# Patient Record
Sex: Male | Born: 1975 | Race: White | Hispanic: No | Marital: Single | Smoking: Current every day smoker
Health system: Southern US, Community
[De-identification: ages and names within clinical notes are randomized; demographics above are authoritative.]

## PROBLEM LIST (undated history)

## (undated) DIAGNOSIS — I469 Cardiac arrest, cause unspecified: Secondary | ICD-10-CM

## (undated) DIAGNOSIS — I5020 Unspecified systolic (congestive) heart failure: Secondary | ICD-10-CM

## (undated) DIAGNOSIS — E785 Hyperlipidemia, unspecified: Secondary | ICD-10-CM

## (undated) DIAGNOSIS — Z720 Tobacco use: Secondary | ICD-10-CM

## (undated) DIAGNOSIS — I2510 Atherosclerotic heart disease of native coronary artery without angina pectoris: Secondary | ICD-10-CM

## (undated) DIAGNOSIS — I213 ST elevation (STEMI) myocardial infarction of unspecified site: Secondary | ICD-10-CM

## (undated) DIAGNOSIS — I10 Essential (primary) hypertension: Secondary | ICD-10-CM

## (undated) DIAGNOSIS — F1290 Cannabis use, unspecified, uncomplicated: Secondary | ICD-10-CM

## (undated) DIAGNOSIS — I255 Ischemic cardiomyopathy: Secondary | ICD-10-CM

## (undated) DIAGNOSIS — F1490 Cocaine use, unspecified, uncomplicated: Secondary | ICD-10-CM

## (undated) HISTORY — DX: Cocaine use, unspecified, uncomplicated: F14.90

## (undated) HISTORY — DX: Essential (primary) hypertension: I10

## (undated) HISTORY — DX: Atherosclerotic heart disease of native coronary artery without angina pectoris: I25.10

## (undated) HISTORY — DX: Cannabis use, unspecified, uncomplicated: F12.90

## (undated) HISTORY — DX: Unspecified systolic (congestive) heart failure: I50.20

## (undated) HISTORY — DX: Tobacco use: Z72.0

## (undated) HISTORY — DX: Ischemic cardiomyopathy: I25.5

## (undated) HISTORY — DX: Hyperlipidemia, unspecified: E78.5

## (undated) HISTORY — DX: ST elevation (STEMI) myocardial infarction of unspecified site: I21.3

## (undated) HISTORY — DX: Cardiac arrest, cause unspecified: I46.9

---

## 2006-05-23 ENCOUNTER — Emergency Department: Payer: Self-pay | Admitting: Emergency Medicine

## 2009-06-29 ENCOUNTER — Emergency Department: Payer: Self-pay | Admitting: Emergency Medicine

## 2009-07-22 ENCOUNTER — Emergency Department: Payer: Self-pay | Admitting: Unknown Physician Specialty

## 2009-08-16 ENCOUNTER — Emergency Department: Payer: Self-pay | Admitting: Emergency Medicine

## 2010-08-29 ENCOUNTER — Emergency Department: Payer: Self-pay | Admitting: Emergency Medicine

## 2012-09-11 ENCOUNTER — Emergency Department: Payer: Self-pay | Admitting: Emergency Medicine

## 2012-09-11 LAB — URINALYSIS, COMPLETE
Blood: NEGATIVE
Glucose,UR: NEGATIVE mg/dL (ref 0–75)
Ketone: NEGATIVE
Ph: 5 (ref 4.5–8.0)
RBC,UR: 1 /HPF (ref 0–5)
Specific Gravity: 1.031 (ref 1.003–1.030)
Squamous Epithelial: NONE SEEN

## 2012-09-11 LAB — CBC
HGB: 16.2 g/dL (ref 13.0–18.0)
MCH: 30.7 pg (ref 26.0–34.0)
MCHC: 34.6 g/dL (ref 32.0–36.0)
MCV: 89 fL (ref 80–100)
WBC: 8 10*3/uL (ref 3.8–10.6)

## 2012-09-11 LAB — COMPREHENSIVE METABOLIC PANEL
Alkaline Phosphatase: 93 U/L (ref 50–136)
Anion Gap: 6 — ABNORMAL LOW (ref 7–16)
BUN: 9 mg/dL (ref 7–18)
Co2: 23 mmol/L (ref 21–32)
Creatinine: 1.21 mg/dL (ref 0.60–1.30)
EGFR (African American): >60
Glucose: 94 mg/dL (ref 65–99)
Osmolality: 272 (ref 275–301)
Potassium: 3.8 mmol/L (ref 3.5–5.1)
SGOT(AST): 21 U/L (ref 15–37)
Sodium: 137 mmol/L (ref 136–145)

## 2012-09-11 LAB — CSF CELL COUNT WITH DIFFERENTIAL
CSF Tube #: 3
CSF Tube #: 1
Eosinophil: 0 %
Eosinophil: 1 %
Lymphocytes: 63 %
Monocytes/Macrophages: 0 %
Monocytes/Macrophages: 12 %
Neutrophils: 0 %
Neutrophils: 24 %
Other Cells: 0 %
Other Cells: 0 %
RBC (CSF): 0 /mm3
RBC (CSF): 125 /mm3
WBC (CSF): 0 /mm3
WBC (CSF): 2 /mm3

## 2012-09-11 LAB — PROTEIN, CSF: Protein, CSF: 89 mg/dL — ABNORMAL HIGH (ref 15–45)

## 2012-09-11 LAB — APTT: Activated PTT: 36.7 secs — ABNORMAL HIGH (ref 23.6–35.9)

## 2012-09-11 LAB — PROTIME-INR
INR: 0.9
Prothrombin Time: 12.8 secs (ref 11.5–14.7)

## 2012-09-11 LAB — GLUCOSE, CSF: Glucose, CSF: 64 mg/dL (ref 40–75)

## 2012-09-15 LAB — CSF CULTURE W GRAM STAIN

## 2013-02-20 ENCOUNTER — Emergency Department: Payer: Self-pay | Admitting: Emergency Medicine

## 2013-08-28 ENCOUNTER — Emergency Department: Payer: Self-pay | Admitting: Emergency Medicine

## 2013-08-28 LAB — URINALYSIS, COMPLETE
BACTERIA: NONE SEEN
BILIRUBIN, UR: NEGATIVE
Blood: NEGATIVE
Glucose,UR: NEGATIVE mg/dL (ref 0–75)
Leukocyte Esterase: NEGATIVE
NITRITE: NEGATIVE
PROTEIN: NEGATIVE
Ph: 5 (ref 4.5–8.0)
Specific Gravity: 1.033 (ref 1.003–1.030)
Squamous Epithelial: <1
WBC UR: 1 /HPF (ref 0–5)

## 2013-08-28 LAB — CBC
HCT: 42.6 % (ref 40.0–52.0)
HGB: 14.2 g/dL (ref 13.0–18.0)
MCH: 30.2 pg (ref 26.0–34.0)
MCHC: 33.4 g/dL (ref 32.0–36.0)
MCV: 91 fL (ref 80–100)
Platelet: 261 10*3/uL (ref 150–440)
RBC: 4.71 10*6/uL (ref 4.40–5.90)
RDW: 13.8 % (ref 11.5–14.5)
WBC: 10.2 10*3/uL (ref 3.8–10.6)

## 2013-08-28 LAB — COMPREHENSIVE METABOLIC PANEL
ALBUMIN: 3.9 g/dL (ref 3.4–5.0)
ALK PHOS: 79 U/L
Anion Gap: 7 (ref 7–16)
BILIRUBIN TOTAL: 0.5 mg/dL (ref 0.2–1.0)
BUN: 9 mg/dL (ref 7–18)
Calcium, Total: 8.9 mg/dL (ref 8.5–10.1)
Chloride: 104 mmol/L (ref 98–107)
Co2: 26 mmol/L (ref 21–32)
Creatinine: 1.11 mg/dL (ref 0.60–1.30)
EGFR (African American): >60
EGFR (Non-African Amer.): >60
Glucose: 141 mg/dL — ABNORMAL HIGH (ref 65–99)
Osmolality: 275 (ref 275–301)
Potassium: 3.7 mmol/L (ref 3.5–5.1)
SGOT(AST): 30 U/L (ref 15–37)
SGPT (ALT): 38 U/L (ref 12–78)
Sodium: 137 mmol/L (ref 136–145)
TOTAL PROTEIN: 7.8 g/dL (ref 6.4–8.2)

## 2015-07-28 IMAGING — CT CT STONE STUDY
2 of 4 series · 17 of 46 positions shown, 19 images · non-contrast
Comparison: 06/29/2009.

CLINICAL DATA: Low back pain radiating to the left flank.

EXAM:
CT ABDOMEN AND PELVIS WITHOUT CONTRAST
TECHNIQUE: Multidetector CT imaging of the abdomen and pelvis was performed
following the standard protocol without IV contrast.

[Series 2: stone standard full · axial · 0.78mm/px · z∈[+129,+589]mm · 14 of 102 slices shown, 16 images]
[im 5/102  soft-tissue]
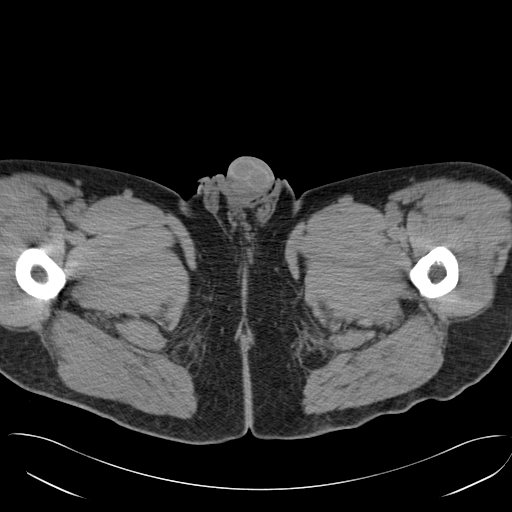
[im 5/102  bone]
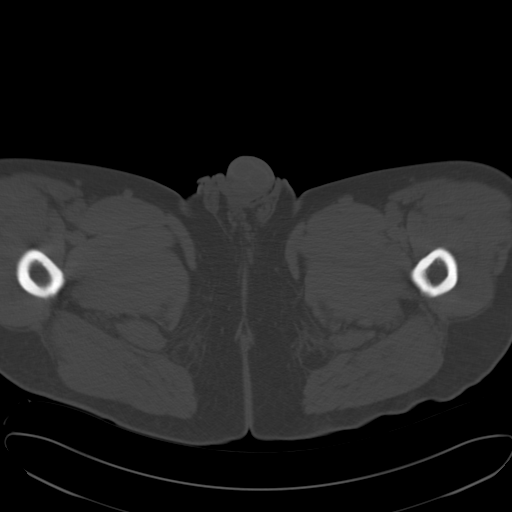
[im 13/102  soft-tissue]
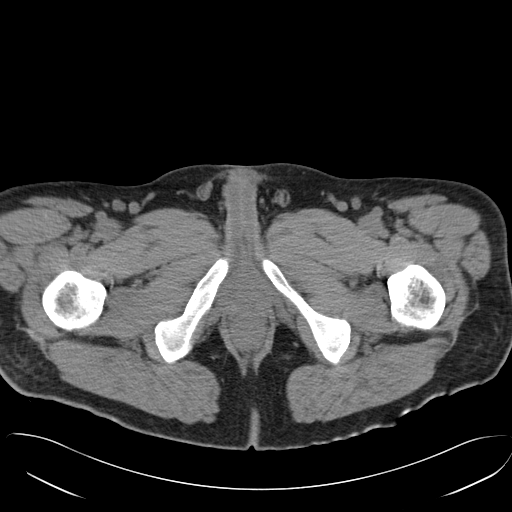
[im 21/102  soft-tissue]
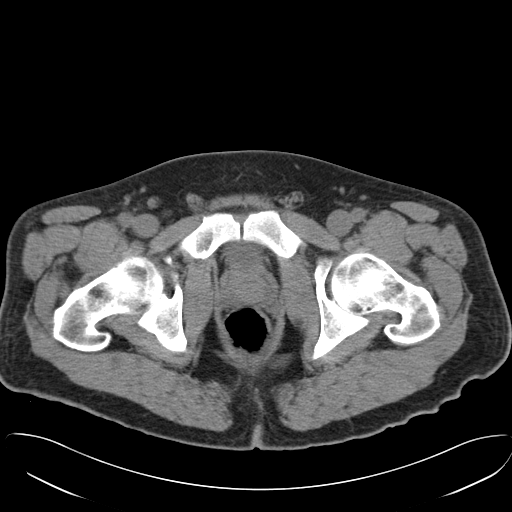
[im 29/102  soft-tissue]
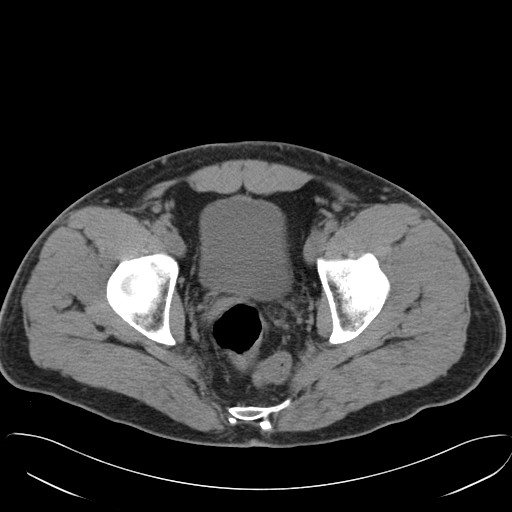
[im 33/102  soft-tissue]
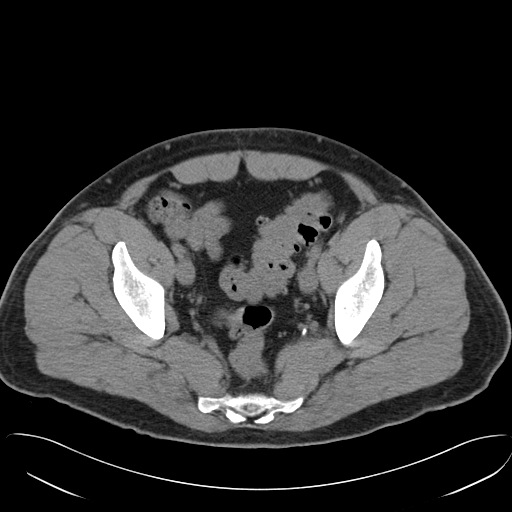
[im 41/102  soft-tissue]
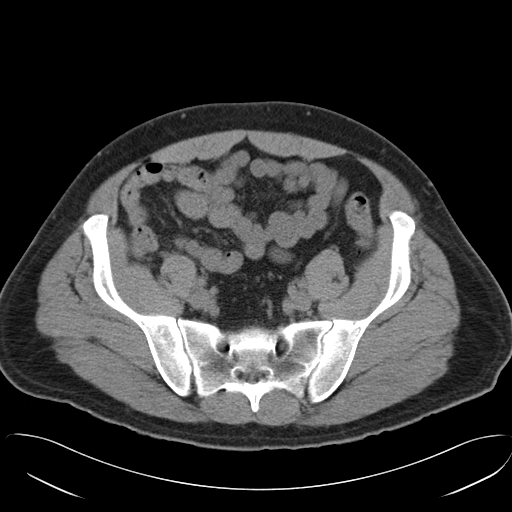
[im 49/102  soft-tissue]
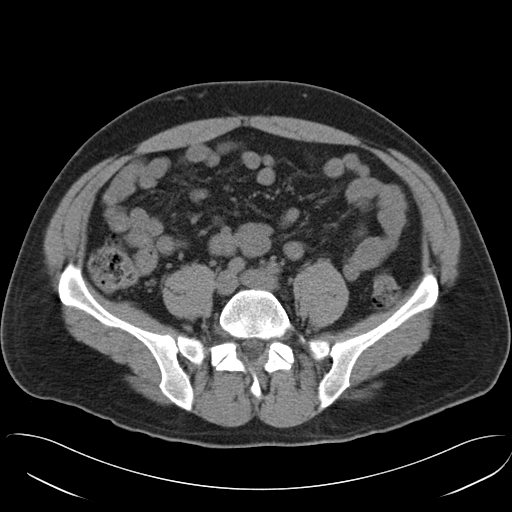
[im 53/102  soft-tissue]
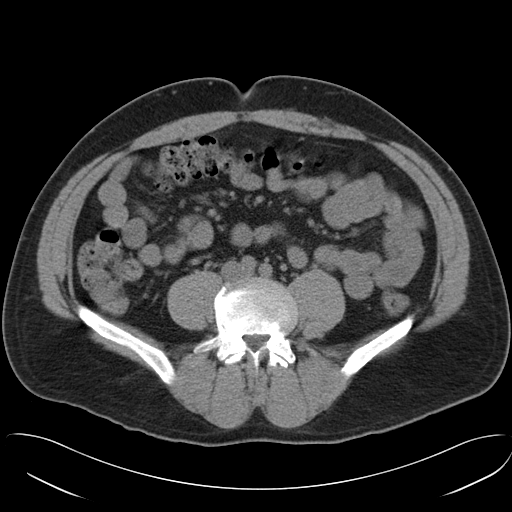
[im 61/102  soft-tissue]
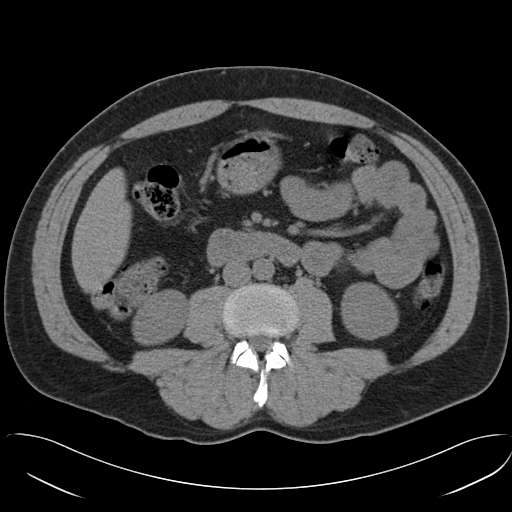
[im 61/102  bone]
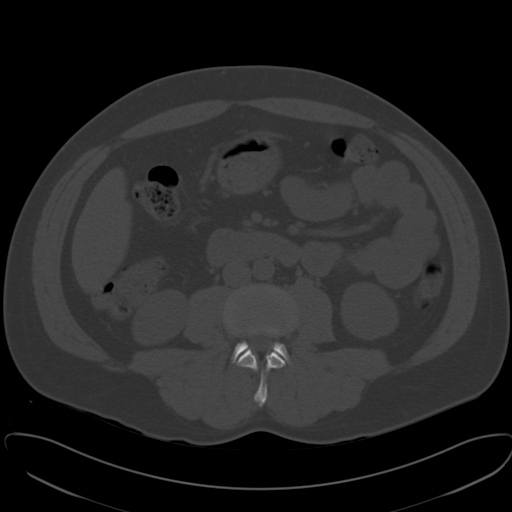
[im 69/102  soft-tissue]
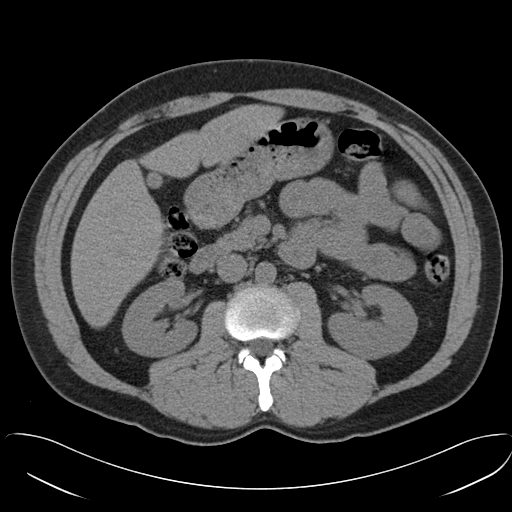
[im 77/102  soft-tissue]
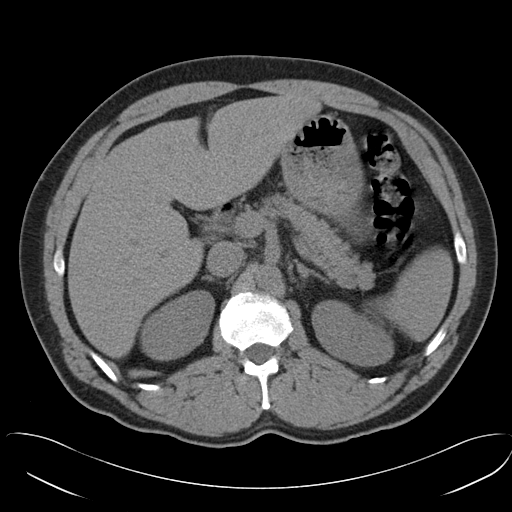
[im 81/102  soft-tissue]
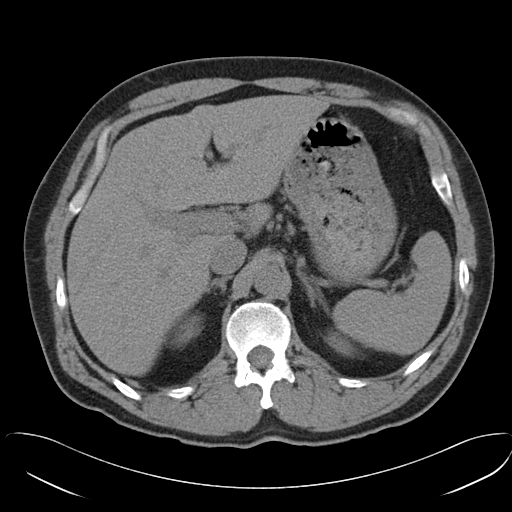
[im 89/102  soft-tissue]
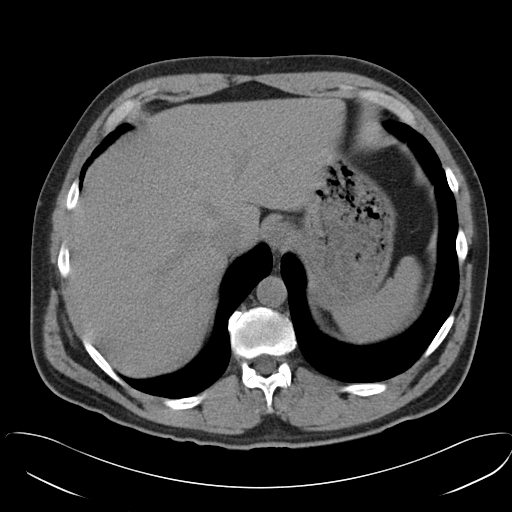
[im 97/102  soft-tissue]
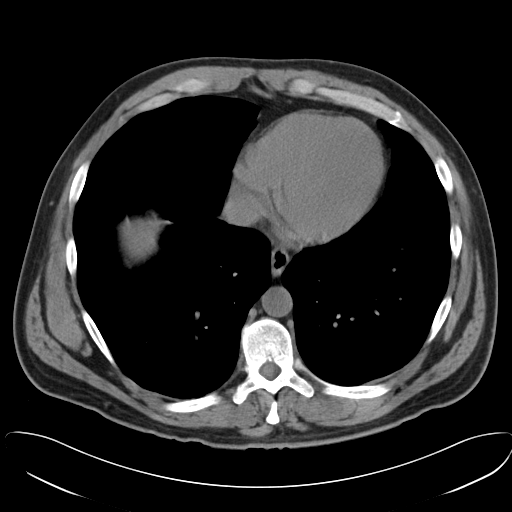

[Series 5: cor stone standard full · coronal · 0.98mm/px · 3 of 147 slices shown]
[im 49/147  soft-tissue]
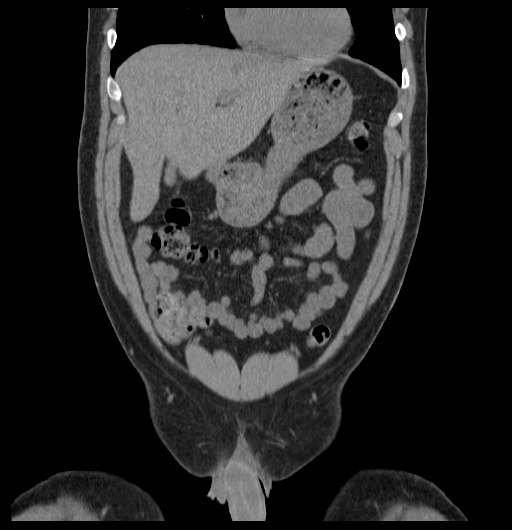
[im 65/147  soft-tissue]
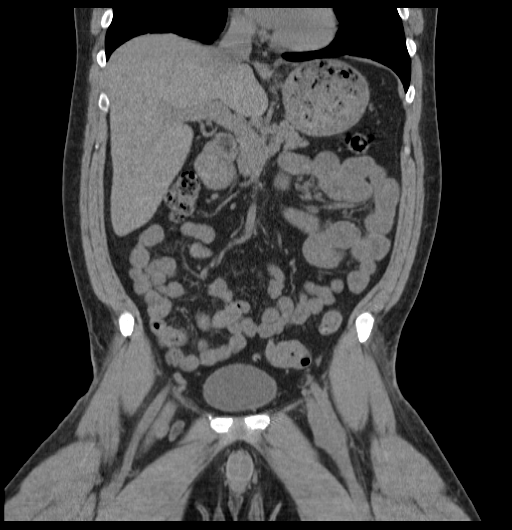
[im 82/147  soft-tissue]
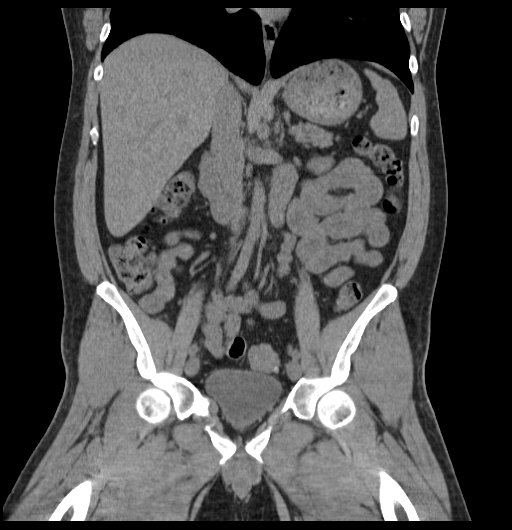

[17 of 46 positions shown; findings below may reference images not displayed]

FINDINGS: Lung Bases: Patchy areas of ground-glass attenuation are seen in
both lower lungs, as before and likely represent compressive
atelectasis.

Liver: Enlarged at 21 cm in craniocaudal length. Otherwise normal
uninfused appearance.

Spleen: Normal uninfused imaging features.

Stomach: Normal.

Pancreas: No focal mass lesion.  No dilatation of the main duct.

Gallbladder/Biliary: Gallbladder is decompressed. There is no intra
or extrahepatic biliary duct dilatation.

Kidneys/Adrenals: No adrenal nodule or mass. No evidence for stones
in either kidney. No ureteral or bladder stones. No secondary
changes in either kidney or ureter.

Bowel Loops: Duodenum is in normal position. No small bowel
obstruction. No evidence for small bowel wall thickening. Terminal
ileum is normal. The appendix is normal. No gross colonic mass.
There is some diverticular changes in the sigmoid colon without
diverticulitis.

Nodes: No pelvic sidewall lymphadenopathy. There is no
retroperitoneal or intraperitoneal lymphadenopathy in the abdomen.

Vasculature: No abdominal aortic aneurysm.

Pelvic Genitourinary: Bladder is normal for the degree of
distention. Prostate gland is normal.

Bones/Musculoskeletal: Bone windows reveal no worrisome lytic or
sclerotic osseous lesions.

Body Wall: No evidence for abdominal wall hernia.

Other: No intraperitoneal free fluid.
IMPRESSION: 1. No acute findings in the abdomen or pelvis. Specifically, no
findings to explain the patient's history of low-back pain and left
flank pain.
2. Hepatomegaly.

## 2018-02-17 ENCOUNTER — Encounter: Payer: Self-pay | Admitting: Emergency Medicine

## 2018-02-17 ENCOUNTER — Emergency Department
Admission: EM | Admit: 2018-02-17 | Discharge: 2018-02-17 | Disposition: A | Discharge status: Home / Self Care | Payer: Self-pay | Attending: Emergency Medicine | Admitting: Emergency Medicine

## 2018-02-17 ENCOUNTER — Other Ambulatory Visit: Payer: Self-pay

## 2018-02-17 DIAGNOSIS — Z87891 Personal history of nicotine dependence: Secondary | ICD-10-CM | POA: Insufficient documentation

## 2018-02-17 DIAGNOSIS — J101 Influenza due to other identified influenza virus with other respiratory manifestations: Secondary | ICD-10-CM | POA: Insufficient documentation

## 2018-02-17 LAB — INFLUENZA PANEL BY PCR (TYPE A & B)
INFLAPCR: POSITIVE — AB
INFLBPCR: NEGATIVE

## 2018-02-17 MED ORDER — IBUPROFEN 800 MG PO TABS
800.0000 mg | ORAL_TABLET | Freq: Once | ORAL | Status: AC
  Administered 2018-02-17: 800 mg via ORAL
  Filled 2018-02-17: qty 1

## 2018-02-17 MED ORDER — HYDROCODONE-HOMATROPINE 5-1.5 MG/5ML PO SYRP: 5.0000 mL | ORAL_SOLUTION | Freq: Four times a day (QID) | ORAL | 0 refills | Status: DC | PRN

## 2018-02-17 NOTE — ED Triage Notes (Signed)
Fever/chills/cough x1 day

## 2018-02-17 NOTE — ED Provider Notes (Signed)
[facility] Emergency Department Provider Note  ____________________________________________   First MD Initiated Contact with Patient 02/17/18 1000     (approximate)  I have reviewed the triage vital signs and the nursing notes.   HISTORY  Chief Complaint Fever    HPI Terrance Thompson is a 42 y.o. male with flulike symptoms, patient is complaining of fever, chills, body aches.  Positive cough, positive sore throat, denies vomiting, denies diarrhea; denies chest pain or sob.  Sx for 1 days   History reviewed. No pertinent past medical history.  There are no active problems to display for this patient.   History reviewed. No pertinent surgical history.  Prior to Admission medications   Medication Sig Start Date End Date Taking? Authorizing Provider  HYDROcodone-homatropine (HYCODAN) 5-1.5 MG/5ML syrup Take 5 mLs by mouth every 6 (six) hours as needed. 02/17/18   Faythe GheeFisher,  W, PA-C    Allergies Patient has no known allergies.  No family history on file.  Social History Social History   Tobacco Use  . Smoking status: Former Games developermoker  . Smokeless tobacco: Former Engineer, waterUser  Substance Use Topics  . Alcohol use: Not on file  . Drug use: Not on file    Review of Systems  Constitutional: Positive fever/chills, body aches Eyes: No visual changes. ENT: No sore throat. Respiratory: Positive cough Genitourinary: Negative for dysuria. Musculoskeletal: Negative for back pain. Skin: Negative for rash.    ____________________________________________   PHYSICAL EXAM:  VITAL SIGNS: ED Triage Vitals  Enc Vitals Group     BP 02/17/18 0922 (!) 146/92     Pulse Rate 02/17/18 0922 (!) 115     Resp 02/17/18 0922 20     Temp 02/17/18 0922 (!) 102.5 F (39.2 C)     Temp Source 02/17/18 0922 Oral     SpO2 02/17/18 0922 95 %     Weight 02/17/18 0923 228 lb (103.4 kg)     Height 02/17/18 0923 6' (1.829 m)     Head Circumference --      Peak Flow --        Pain Score 02/17/18 0922 10     Pain Loc --      Pain Edu? --      Excl. in GC? --     Constitutional: Alert and oriented. Well appearing and in no acute distress. Eyes: Conjunctivae are normal.  Head: Atraumatic. Nose: No congestion/rhinnorhea. Mouth/Throat: Mucous membranes are moist.  Throat appears normal Neck:  supple no lymphadenopathy noted Cardiovascular: Normal rate, regular rhythm. Heart sounds are normal Respiratory: Normal respiratory effort.  No retractions, lungs c t a, cough is very dry and hacking Abd: soft nontender bs normal all 4 quad GU: deferred Musculoskeletal: FROM all extremities, warm and well perfused Neurologic:  Normal speech and language.  Skin:  Skin is warm, dry and intact. No rash noted. Psychiatric: Mood and affect are normal. Speech and behavior are normal.  ____________________________________________   LABS (all labs ordered are listed, but only abnormal results are displayed)  Labs Reviewed  INFLUENZA PANEL BY PCR (TYPE A & B) - Abnormal; Notable for the following components:      Result Value   Influenza A By PCR POSITIVE (*)    All other components within normal limits   ____________________________________________   ____________________________________________  RADIOLOGY    ____________________________________________   PROCEDURES  Procedure(s) performed: No  Procedures    ____________________________________________   INITIAL IMPRESSION / ASSESSMENT AND PLAN / ED  COURSE  Pertinent labs & imaging results that were available during my care of the patient were reviewed by me and considered in my medical decision making (see chart for details).   Patient is 42 year old male presents emergency department flulike symptoms.  Physical exam shows a febrile adult with elevated heart rate.  Cough is very dry and hacking.  Remainder of the exam is unremarkable  Flu swab is positive for influenza A  Patient had been  given ibuprofen 800 mg p.o. while here in the ED for fever.  He was encouraged to start drinking fluids.  He was able to tolerate water while in the ED.  He was given a prescription for Hycodan cough syrup.  We discussed Tamiflu and he states due to the price he does not want to buy this medication.  He was given a work note stating that he is contagious until he is not had a fever for 24 hours.  He states he understands will comply with all instructions.  Was discharged in stable condition.     As part of my medical decision making, I reviewed the following data within the electronic MEDICAL RECORD NUMBER History obtained from family, Nursing notes reviewed and incorporated, Labs reviewed flu test is positive for influenza A, Notes from prior ED visits and [place] Controlled Substance Database  ____________________________________________   FINAL CLINICAL IMPRESSION(S) / ED DIAGNOSES  Final diagnoses:  Influenza A      NEW MEDICATIONS STARTED DURING THIS VISIT:  New Prescriptions   HYDROCODONE-HOMATROPINE (HYCODAN) 5-1.5 MG/5ML SYRUP    Take 5 mLs by mouth every 6 (six) hours as needed.     Note:  This document was prepared using Dragon voice recognition software and may include unintentional dictation errors.    Faythe GheeFisher,  W, PA-C 02/17/18 1101    Sharman CheekStafford, Phillip, MD 02/25/18 [phone]

## 2018-02-17 NOTE — ED Notes (Signed)
Pt c/o fever with bodyaches and cough congestion that started last night. Last taken any medication was last night,.

## 2018-02-17 NOTE — Discharge Instructions (Addendum)
Follow-up with your regular doctor the acute care if not better in 3 to 5 days.  Return to the emergency department if you are worsening.    Return to the emergency department if you feel you are getting a secondary infection such as pneumonia.  The symptoms would include increasing cough, shortness of breath, or chest pain..  Take the cough medication as prescribed.  Tylenol and ibuprofen for fever.  Drink plenty of fluids.

## 2022-07-22 ENCOUNTER — Emergency Department: Payer: Self-pay

## 2022-07-22 ENCOUNTER — Other Ambulatory Visit: Payer: Self-pay

## 2022-07-22 ENCOUNTER — Inpatient Hospital Stay
Admission: EM | Admit: 2022-07-22 | Discharge: 2022-07-25 | DRG: 321 | Disposition: A | Discharge status: Home / Self Care | Payer: Self-pay | Attending: Internal Medicine | Admitting: Internal Medicine

## 2022-07-22 ENCOUNTER — Inpatient Hospital Stay (HOSPITAL_COMMUNITY)
Admit: 2022-07-22 | Discharge: 2022-07-22 | Disposition: A | Discharge status: Home / Self Care | Payer: Self-pay | Attending: Cardiovascular Disease | Admitting: Cardiovascular Disease

## 2022-07-22 ENCOUNTER — Encounter
Admission: EM | Disposition: A | Discharge status: Home / Self Care | Payer: Self-pay | Source: Home / Self Care | Attending: Pulmonary Disease

## 2022-07-22 ENCOUNTER — Inpatient Hospital Stay: Payer: Self-pay

## 2022-07-22 DIAGNOSIS — K7200 Acute and subacute hepatic failure without coma: Secondary | ICD-10-CM | POA: Diagnosis not present

## 2022-07-22 DIAGNOSIS — I5041 Acute combined systolic (congestive) and diastolic (congestive) heart failure: Secondary | ICD-10-CM | POA: Diagnosis not present

## 2022-07-22 DIAGNOSIS — I252 Old myocardial infarction: Secondary | ICD-10-CM

## 2022-07-22 DIAGNOSIS — Z79899 Other long term (current) drug therapy: Secondary | ICD-10-CM

## 2022-07-22 DIAGNOSIS — I5020 Unspecified systolic (congestive) heart failure: Secondary | ICD-10-CM

## 2022-07-22 DIAGNOSIS — I469 Cardiac arrest, cause unspecified: Principal | ICD-10-CM | POA: Diagnosis present

## 2022-07-22 DIAGNOSIS — F17210 Nicotine dependence, cigarettes, uncomplicated: Secondary | ICD-10-CM | POA: Diagnosis present

## 2022-07-22 DIAGNOSIS — F1410 Cocaine abuse, uncomplicated: Secondary | ICD-10-CM | POA: Diagnosis present

## 2022-07-22 DIAGNOSIS — I2109 ST elevation (STEMI) myocardial infarction involving other coronary artery of anterior wall: Secondary | ICD-10-CM

## 2022-07-22 DIAGNOSIS — I462 Cardiac arrest due to underlying cardiac condition: Secondary | ICD-10-CM | POA: Diagnosis present

## 2022-07-22 DIAGNOSIS — F1490 Cocaine use, unspecified, uncomplicated: Secondary | ICD-10-CM

## 2022-07-22 DIAGNOSIS — J690 Pneumonitis due to inhalation of food and vomit: Secondary | ICD-10-CM | POA: Diagnosis not present

## 2022-07-22 DIAGNOSIS — Z720 Tobacco use: Secondary | ICD-10-CM

## 2022-07-22 DIAGNOSIS — R0902 Hypoxemia: Secondary | ICD-10-CM | POA: Diagnosis present

## 2022-07-22 DIAGNOSIS — I2510 Atherosclerotic heart disease of native coronary artery without angina pectoris: Secondary | ICD-10-CM | POA: Diagnosis present

## 2022-07-22 DIAGNOSIS — E785 Hyperlipidemia, unspecified: Secondary | ICD-10-CM | POA: Diagnosis present

## 2022-07-22 DIAGNOSIS — I4901 Ventricular fibrillation: Secondary | ICD-10-CM | POA: Diagnosis present

## 2022-07-22 DIAGNOSIS — I5021 Acute systolic (congestive) heart failure: Secondary | ICD-10-CM

## 2022-07-22 HISTORY — PX: LEFT HEART CATH AND CORONARY ANGIOGRAPHY: CATH118249

## 2022-07-22 HISTORY — PX: CORONARY/GRAFT ACUTE MI REVASCULARIZATION: CATH118305

## 2022-07-22 LAB — CBC WITH DIFFERENTIAL/PLATELET
Abs Immature Granulocytes: 0.05 10*3/uL (ref 0.00–0.07)
Basophils Absolute: 0.1 10*3/uL (ref 0.0–0.1)
Basophils Relative: 1 %
Eosinophils Absolute: 0.2 10*3/uL (ref 0.0–0.5)
Eosinophils Relative: 2 %
HCT: 48.3 % (ref 39.0–52.0)
Hemoglobin: 16.3 g/dL (ref 13.0–17.0)
Immature Granulocytes: 1 %
Lymphocytes Relative: 40 %
Lymphs Abs: 4.3 10*3/uL — ABNORMAL HIGH (ref 0.7–4.0)
MCH: 29.7 pg (ref 26.0–34.0)
MCHC: 33.7 g/dL (ref 30.0–36.0)
MCV: 88 fL (ref 80.0–100.0)
Monocytes Absolute: 1.2 10*3/uL — ABNORMAL HIGH (ref 0.1–1.0)
Monocytes Relative: 11 %
Neutro Abs: 5.1 10*3/uL (ref 1.7–7.7)
Neutrophils Relative %: 45 %
Platelets: 287 10*3/uL (ref 150–400)
RBC: 5.49 MIL/uL (ref 4.22–5.81)
RDW: 14.1 % (ref 11.5–15.5)
WBC: 10.9 10*3/uL — ABNORMAL HIGH (ref 4.0–10.5)
nRBC: 0 % (ref 0.0–0.2)

## 2022-07-22 LAB — BLOOD GAS, ARTERIAL
Acid-base deficit: 2.6 mmol/L — ABNORMAL HIGH (ref 0.0–2.0)
Bicarbonate: 21.8 mmol/L (ref 20.0–28.0)
FIO2: 50 %
MECHVT: 500 mL
Mechanical Rate: 20
O2 Saturation: 99.3 %
PEEP: 5 cmH2O
Patient temperature: 37
pCO2 arterial: 36 mmHg (ref 32–48)
pH, Arterial: 7.39 (ref 7.35–7.45)
pO2, Arterial: 128 mmHg — ABNORMAL HIGH (ref 83–108)

## 2022-07-22 LAB — COMPREHENSIVE METABOLIC PANEL
ALT: 36 U/L (ref 0–44)
ALT: 68 U/L — ABNORMAL HIGH (ref 0–44)
AST: 51 U/L — ABNORMAL HIGH (ref 15–41)
AST: 249 U/L — ABNORMAL HIGH (ref 15–41)
Albumin: 4.3 g/dL (ref 3.5–5.0)
Albumin: 4.3 g/dL (ref 3.5–5.0)
Alkaline Phosphatase: 84 U/L (ref 38–126)
Alkaline Phosphatase: 83 U/L (ref 38–126)
Anion gap: 9 (ref 5–15)
Anion gap: 10 (ref 5–15)
BUN: 14 mg/dL (ref 6–20)
BUN: 16 mg/dL (ref 6–20)
CO2: 22 mmol/L (ref 22–32)
CO2: 22 mmol/L (ref 22–32)
Calcium: 9.1 mg/dL (ref 8.9–10.3)
Calcium: 8.8 mg/dL — ABNORMAL LOW (ref 8.9–10.3)
Chloride: 108 mmol/L (ref 98–111)
Chloride: 106 mmol/L (ref 98–111)
Creatinine, Ser: 1.06 mg/dL (ref 0.61–1.24)
Creatinine, Ser: 1.01 mg/dL (ref 0.61–1.24)
GFR, Estimated: >60 mL/min (ref >60)
GFR, Estimated: >60 mL/min (ref >60)
Glucose, Bld: 149 mg/dL — ABNORMAL HIGH (ref 70–99)
Glucose, Bld: 104 mg/dL — ABNORMAL HIGH (ref 70–99)
Potassium: 3.6 mmol/L (ref 3.5–5.1)
Potassium: 3.6 mmol/L (ref 3.5–5.1)
Sodium: 139 mmol/L (ref 135–145)
Sodium: 138 mmol/L (ref 135–145)
Total Bilirubin: 0.7 mg/dL (ref 0.3–1.2)
Total Bilirubin: 1 mg/dL (ref 0.3–1.2)
Total Protein: 7.5 g/dL (ref 6.5–8.1)
Total Protein: 7.4 g/dL (ref 6.5–8.1)

## 2022-07-22 LAB — URINE DRUG SCREEN, QUALITATIVE (ARMC ONLY)
Amphetamines, Ur Screen: NOT DETECTED
Barbiturates, Ur Screen: NOT DETECTED
Benzodiazepine, Ur Scrn: NOT DETECTED
Cannabinoid 50 Ng, Ur ~~LOC~~: POSITIVE — AB
Cocaine Metabolite,Ur ~~LOC~~: POSITIVE — AB
MDMA (Ecstasy)Ur Screen: NOT DETECTED
Methadone Scn, Ur: NOT DETECTED
Opiate, Ur Screen: NOT DETECTED
Phencyclidine (PCP) Ur S: NOT DETECTED
Tricyclic, Ur Screen: NOT DETECTED

## 2022-07-22 LAB — URINALYSIS, ROUTINE W REFLEX MICROSCOPIC
Bacteria, UA: NONE SEEN
Bilirubin Urine: NEGATIVE
Glucose, UA: 50 mg/dL — AB
Hgb urine dipstick: NEGATIVE
Ketones, ur: NEGATIVE mg/dL
Leukocytes,Ua: NEGATIVE
Nitrite: NEGATIVE
Protein, ur: >=300 mg/dL — AB
Specific Gravity, Urine: 1.016 (ref 1.005–1.030)
pH: 6 (ref 5.0–8.0)

## 2022-07-22 LAB — PHOSPHORUS: Phosphorus: 5.1 mg/dL — ABNORMAL HIGH (ref 2.5–4.6)

## 2022-07-22 LAB — LIPID PANEL
Cholesterol: 204 mg/dL — ABNORMAL HIGH (ref 0–200)
HDL: 36 mg/dL — ABNORMAL LOW (ref >40)
LDL Cholesterol: 134 mg/dL — ABNORMAL HIGH (ref 0–99)
Total CHOL/HDL Ratio: 5.7 RATIO
Triglycerides: 171 mg/dL — ABNORMAL HIGH (ref <150)
VLDL: 34 mg/dL (ref 0–40)

## 2022-07-22 LAB — PROTIME-INR
INR: 1 (ref 0.8–1.2)
Prothrombin Time: 13.2 seconds (ref 11.4–15.2)

## 2022-07-22 LAB — MRSA NEXT GEN BY PCR, NASAL: MRSA by PCR Next Gen: NOT DETECTED

## 2022-07-22 LAB — TROPONIN I (HIGH SENSITIVITY)
Troponin I (High Sensitivity): 276 ng/L (ref <18)
Troponin I (High Sensitivity): >24000 ng/L (ref <18)

## 2022-07-22 LAB — LIPASE, BLOOD: Lipase: 37 U/L (ref 11–51)

## 2022-07-22 LAB — APTT: aPTT: 31 seconds (ref 24–36)

## 2022-07-22 LAB — PROCALCITONIN: Procalcitonin: <0.1 ng/mL

## 2022-07-22 LAB — GLUCOSE, CAPILLARY
Glucose-Capillary: 85 mg/dL (ref 70–99)
Glucose-Capillary: 93 mg/dL (ref 70–99)
Glucose-Capillary: 100 mg/dL — ABNORMAL HIGH (ref 70–99)

## 2022-07-22 LAB — MAGNESIUM: Magnesium: 2.3 mg/dL (ref 1.7–2.4)

## 2022-07-22 SURGERY — CORONARY/GRAFT ACUTE MI REVASCULARIZATION
Anesthesia: Moderate Sedation

## 2022-07-22 MED ORDER — CHLORHEXIDINE GLUCONATE CLOTH 2 % EX PADS
6.0000 | MEDICATED_PAD | Freq: Every day | CUTANEOUS | Status: DC
  Administered 2022-07-24: 6 via TOPICAL

## 2022-07-22 MED ORDER — FUROSEMIDE 10 MG/ML IJ SOLN
INTRAMUSCULAR | Status: AC
  Filled 2022-07-22: qty 4

## 2022-07-22 MED ORDER — FENTANYL 2500MCG IN NS 250ML (10MCG/ML) PREMIX INFUSION
0.0000 ug/h | INTRAVENOUS | Status: DC
  Administered 2022-07-22: 25 ug/h via INTRAVENOUS
  Filled 2022-07-22: qty 250

## 2022-07-22 MED ORDER — TICAGRELOR 90 MG PO TABS
ORAL_TABLET | ORAL | Status: DC | PRN
  Administered 2022-07-22: 180 mg via ORAL

## 2022-07-22 MED ORDER — CANGRELOR BOLUS VIA INFUSION
INTRAVENOUS | Status: DC | PRN
  Administered 2022-07-22: 3102 ug via INTRAVENOUS

## 2022-07-22 MED ORDER — HEPARIN (PORCINE) IN NACL 2000-0.9 UNIT/L-% IV SOLN
INTRAVENOUS | Status: DC | PRN
  Administered 2022-07-22: 1000 mL

## 2022-07-22 MED ORDER — FUROSEMIDE 10 MG/ML IJ SOLN
INTRAMUSCULAR | Status: DC | PRN
  Administered 2022-07-22: 40 mg via INTRAVENOUS

## 2022-07-22 MED ORDER — LIDOCAINE HCL 1 % IJ SOLN
INTRAMUSCULAR | Status: AC
  Filled 2022-07-22: qty 20

## 2022-07-22 MED ORDER — NITROGLYCERIN 1 MG/10 ML FOR IR/CATH LAB
INTRA_ARTERIAL | Status: AC
  Filled 2022-07-22: qty 10

## 2022-07-22 MED ORDER — SODIUM CHLORIDE 0.9 % IV SOLN: 250.0000 mL | INTRAVENOUS | Status: DC | PRN

## 2022-07-22 MED ORDER — PROPOFOL 1000 MG/100ML IV EMUL
5.0000 ug/kg/min | INTRAVENOUS | Status: DC
  Administered 2022-07-22: 10 ug/kg/min via INTRAVENOUS
  Administered 2022-07-22: 35 ug/kg/min via INTRAVENOUS
  Administered 2022-07-22: 48.356 ug/kg/min via INTRAVENOUS
  Administered 2022-07-23 (×2): 35 ug/kg/min via INTRAVENOUS
  Filled 2022-07-22: qty 100
  Filled 2022-07-22: qty 200
  Filled 2022-07-22 (×2): qty 100

## 2022-07-22 MED ORDER — NOREPINEPHRINE 4 MG/250ML-% IV SOLN: 2.0000 ug/min | INTRAVENOUS | Status: DC

## 2022-07-22 MED ORDER — TICAGRELOR 90 MG PO TABS
90.0000 mg | ORAL_TABLET | Freq: Two times a day (BID) | ORAL | Status: DC
  Administered 2022-07-22: 90 mg via ORAL
  Filled 2022-07-22: qty 1

## 2022-07-22 MED ORDER — CANGRELOR TETRASODIUM 50 MG IV SOLR
INTRAVENOUS | Status: AC
  Filled 2022-07-22: qty 50

## 2022-07-22 MED ORDER — VERAPAMIL HCL 2.5 MG/ML IV SOLN
INTRAVENOUS | Status: DC | PRN
  Administered 2022-07-22: 2.5 mg via INTRAVENOUS

## 2022-07-22 MED ORDER — VERAPAMIL HCL 2.5 MG/ML IV SOLN
INTRAVENOUS | Status: AC
  Filled 2022-07-22: qty 2

## 2022-07-22 MED ORDER — SODIUM CHLORIDE 0.9% FLUSH
3.0000 mL | Freq: Two times a day (BID) | INTRAVENOUS | Status: DC
  Administered 2022-07-23 – 2022-07-25 (×5): 3 mL via INTRAVENOUS

## 2022-07-22 MED ORDER — DOCUSATE SODIUM 50 MG/5ML PO LIQD
100.0000 mg | Freq: Two times a day (BID) | ORAL | Status: DC
  Administered 2022-07-22 – 2022-07-23 (×2): 100 mg
  Filled 2022-07-22 (×2): qty 10

## 2022-07-22 MED ORDER — IOHEXOL 300 MG/ML  SOLN
INTRAMUSCULAR | Status: DC | PRN
  Administered 2022-07-22: 123 mL

## 2022-07-22 MED ORDER — NITROGLYCERIN 1 MG/10 ML FOR IR/CATH LAB
INTRA_ARTERIAL | Status: DC | PRN
  Administered 2022-07-22: 200 ug

## 2022-07-22 MED ORDER — SODIUM CHLORIDE 0.9 % IV SOLN
4.0000 ug/kg/min | INTRAVENOUS | Status: AC
  Filled 2022-07-22 (×2): qty 50

## 2022-07-22 MED ORDER — ORAL CARE MOUTH RINSE: 15.0000 mL | OROMUCOSAL | Status: DC | PRN

## 2022-07-22 MED ORDER — TICAGRELOR 90 MG PO TABS
90.0000 mg | ORAL_TABLET | Freq: Two times a day (BID) | ORAL | Status: DC
  Administered 2022-07-23: 90 mg
  Filled 2022-07-22: qty 1

## 2022-07-22 MED ORDER — ACETAMINOPHEN 325 MG PO TABS: 650.0000 mg | ORAL_TABLET | ORAL | Status: DC | PRN

## 2022-07-22 MED ORDER — HEPARIN SODIUM (PORCINE) 5000 UNIT/ML IJ SOLN
4000.0000 [IU] | Freq: Once | INTRAMUSCULAR | Status: AC
  Administered 2022-07-22: 4000 [IU] via INTRAVENOUS
  Filled 2022-07-22: qty 1

## 2022-07-22 MED ORDER — ASPIRIN 81 MG PO CHEW
81.0000 mg | CHEWABLE_TABLET | Freq: Every day | ORAL | Status: DC
  Administered 2022-07-23: 81 mg via ORAL
  Filled 2022-07-22: qty 1

## 2022-07-22 MED ORDER — MIDAZOLAM HCL 2 MG/2ML IJ SOLN
1.0000 mg | INTRAMUSCULAR | Status: DC | PRN
  Administered 2022-07-22 – 2022-07-23 (×4): 2 mg via INTRAVENOUS
  Filled 2022-07-22 (×4): qty 2

## 2022-07-22 MED ORDER — ORAL CARE MOUTH RINSE
15.0000 mL | OROMUCOSAL | Status: DC
  Administered 2022-07-22 – 2022-07-23 (×8): 15 mL via OROMUCOSAL

## 2022-07-22 MED ORDER — HEPARIN SODIUM (PORCINE) 1000 UNIT/ML IJ SOLN
INTRAMUSCULAR | Status: DC | PRN
  Administered 2022-07-22: 6000 [IU] via INTRAVENOUS
  Administered 2022-07-22: 4000 [IU] via INTRAVENOUS

## 2022-07-22 MED ORDER — SODIUM CHLORIDE 0.9 % IV SOLN: INTRAVENOUS | Status: DC

## 2022-07-22 MED ORDER — LORAZEPAM 2 MG/ML IJ SOLN
2.0000 mg | Freq: Once | INTRAMUSCULAR | Status: AC
  Administered 2022-07-22: 2 mg via INTRAVENOUS

## 2022-07-22 MED ORDER — HEPARIN SODIUM (PORCINE) 1000 UNIT/ML IJ SOLN
INTRAMUSCULAR | Status: AC
  Filled 2022-07-22: qty 10

## 2022-07-22 MED ORDER — CARVEDILOL 3.125 MG PO TABS
3.1250 mg | ORAL_TABLET | Freq: Two times a day (BID) | ORAL | Status: DC
  Administered 2022-07-23: 3.125 mg
  Filled 2022-07-22: qty 1

## 2022-07-22 MED ORDER — HEPARIN (PORCINE) IN NACL 1000-0.9 UT/500ML-% IV SOLN
INTRAVENOUS | Status: AC
  Filled 2022-07-22: qty 1000

## 2022-07-22 MED ORDER — ATORVASTATIN CALCIUM 20 MG PO TABS
80.0000 mg | ORAL_TABLET | Freq: Every day | ORAL | Status: DC
  Administered 2022-07-22 – 2022-07-23 (×2): 80 mg
  Filled 2022-07-22 (×2): qty 4

## 2022-07-22 MED ORDER — ETOMIDATE 2 MG/ML IV SOLN
20.0000 mg | Freq: Once | INTRAVENOUS | Status: AC
  Administered 2022-07-22: 20 mg via INTRAVENOUS

## 2022-07-22 MED ORDER — ENOXAPARIN SODIUM 40 MG/0.4ML IJ SOSY
40.0000 mg | PREFILLED_SYRINGE | INTRAMUSCULAR | Status: DC
  Administered 2022-07-23 – 2022-07-25 (×3): 40 mg via SUBCUTANEOUS
  Filled 2022-07-22 (×3): qty 0.4

## 2022-07-22 MED ORDER — SODIUM CHLORIDE 0.9 % IV SOLN
INTRAVENOUS | Status: AC | PRN
  Administered 2022-07-22: 4 ug/kg/min via INTRAVENOUS

## 2022-07-22 MED ORDER — POLYETHYLENE GLYCOL 3350 17 G PO PACK
17.0000 g | PACK | Freq: Every day | ORAL | Status: DC
  Administered 2022-07-22: 17 g
  Filled 2022-07-22: qty 1

## 2022-07-22 MED ORDER — LIDOCAINE HCL (PF) 1 % IJ SOLN
INTRAMUSCULAR | Status: DC | PRN
  Administered 2022-07-22: 5 mL via SUBCUTANEOUS

## 2022-07-22 MED ORDER — FAMOTIDINE 20 MG PO TABS
20.0000 mg | ORAL_TABLET | Freq: Two times a day (BID) | ORAL | Status: DC
  Administered 2022-07-22 – 2022-07-23 (×2): 20 mg
  Filled 2022-07-22 (×2): qty 1

## 2022-07-22 MED ORDER — ONDANSETRON HCL 4 MG/2ML IJ SOLN: 4.0000 mg | Freq: Four times a day (QID) | INTRAMUSCULAR | Status: DC | PRN

## 2022-07-22 MED ORDER — PANTOPRAZOLE SODIUM 40 MG IV SOLR
40.0000 mg | INTRAVENOUS | Status: DC
  Administered 2022-07-22 – 2022-07-24 (×3): 40 mg via INTRAVENOUS
  Filled 2022-07-22 (×3): qty 10

## 2022-07-22 MED ORDER — SODIUM CHLORIDE 0.9 % IV SOLN: 250.0000 mL | INTRAVENOUS | Status: DC

## 2022-07-22 MED ORDER — SUCCINYLCHOLINE CHLORIDE 200 MG/10ML IV SOSY
100.0000 mg | PREFILLED_SYRINGE | Freq: Once | INTRAVENOUS | Status: AC
  Administered 2022-07-22: 100 mg via INTRAVENOUS

## 2022-07-22 MED ORDER — SODIUM CHLORIDE 0.9% FLUSH: 3.0000 mL | INTRAVENOUS | Status: DC | PRN

## 2022-07-22 MED ORDER — TICAGRELOR 90 MG PO TABS
ORAL_TABLET | ORAL | Status: AC
  Filled 2022-07-22: qty 2

## 2022-07-22 SURGICAL SUPPLY — 19 items
BALLN TREK RX 2.75X15 (BALLOONS) ×1
BALLN ~~LOC~~ EUPHORA RX 4.0X15 (BALLOONS) ×1
BALLOON TREK RX 2.75X15 (BALLOONS) IMPLANT
BALLOON ~~LOC~~ EUPHORA RX 4.0X15 (BALLOONS) IMPLANT
CATH INFINITI JR4 5F (CATHETERS) IMPLANT
CATH LAUNCHER 6FR EBU3.5 (CATHETERS) IMPLANT
DEVICE RAD TR BAND REGULAR (VASCULAR PRODUCTS) IMPLANT
GLIDESHEATH SLEND SS 6F .021 (SHEATH) IMPLANT
GUIDEWIRE INQWIRE 1.5J.035X260 (WIRE) IMPLANT
INQWIRE 1.5J .035X260CM (WIRE) ×1
KIT ENCORE 26 ADVANTAGE (KITS) IMPLANT
KIT SYRINGE INJ CVI SPIKEX1 (MISCELLANEOUS) IMPLANT
PACK CARDIAC CATH (CUSTOM PROCEDURE TRAY) ×1 IMPLANT
PROTECTION STATION PRESSURIZED (MISCELLANEOUS) ×1
SET ATX-X65L (MISCELLANEOUS) IMPLANT
STATION PROTECTION PRESSURIZED (MISCELLANEOUS) IMPLANT
STENT ONYX FRONTIER 3.5X18 (Permanent Stent) IMPLANT
TUBING CIL FLEX 10 FLL-RA (TUBING) IMPLANT
WIRE RUNTHROUGH .014X180CM (WIRE) IMPLANT

## 2022-07-22 NOTE — ED Notes (Signed)
Pt transported to cath lab with RT, 2 RN and cardiologist.

## 2022-07-22 NOTE — ED Notes (Signed)
20 mg etomidate given

## 2022-07-22 NOTE — H&P (Signed)
CRITICAL CARE PROGRESS NOTE    Name: Terrance Thompson MRN: [id number] DOB: [birth year]     LOS: 0   SUBJECTIVE FINDINGS & SIGNIFICANT EVENTS    History of Presenting Illness:   This 47 year old male with essentially no previous medical history, tobacco smoking and cocaine abuse who came in after having acute anterior myocardial infarction. He apparently had exertional chest pain for some time. He sustained LOC while home with family and they started CPR on him.  EMS did perform defibrillation for VF with ACLS and subsequent ROSC.  Patient was severely agitated required emergent cardiac evaluation.  Status post consultation with cardiology and immediate cardiac cath was ordered however patient progressively became more agitated with requirement for endotracheal intubation.  Intraoperatively he had successful PCI and is arriving to medical intensive care unit on mechanical ventilation for medical management and weaning.  Lines/tubes : Airway 8 mm (Active)     NG/OG Vented/Dual Lumen 16 Fr. Oral (Active)  Tube Position (Required) Marking at nare/corner of mouth 07/22/22 1514  Measurement (cm) (Required) 60 cm 07/22/22 1514     Urethral Catheter vet, rn Temperature probe 16 Fr. (Active)    Microbiology/Sepsis markers: No results found for this or any previous visit.  Anti-infectives:  Anti-infectives (From admission, onward)    None      PAST MEDICAL HISTORY   No past medical history on file.   SURGICAL HISTORY   No past surgical history on file.   FAMILY HISTORY   No family history on file.   SOCIAL HISTORY   Social History   Tobacco Use   Smoking status: Former   Smokeless tobacco: Former     MEDICATIONS   Current Medication:  Current Facility-Administered Medications:    0.9 %  sodium  chloride infusion, , Intravenous, Continuous, Kinner, Robert, MD, Last Rate: 10 mL/hr at 07/22/22 1501, New Bag at 07/22/22 1501   propofol (DIPRIVAN) 1000 MG/100ML infusion, 5-80 mcg/kg/min, Intravenous, Continuous, Jene Every, MD, Last Rate: 30 mL/hr at 07/22/22 1449, Other (enter comment in med admin window) at 07/22/22 1450  Current Outpatient Medications:    HYDROcodone-homatropine (HYCODAN) 5-1.5 MG/5ML syrup, Take 5 mLs by mouth every 6 (six) hours as needed., Disp: 120 mL, Rfl: 0    ALLERGIES   Patient has no known allergies.    REVIEW OF SYSTEMS     10 point ROS unable to conduct due to sedation and mechanical ventilation   PHYSICAL EXAMINATION   Vital Signs: Pulse Rate:  [94-110] 94 (06/01 1500) Resp:  [25] 25 (06/01 1500) BP: (115-193)/(88-127) 115/88 (06/01 1500) SpO2:  [90 %-100 %] 100 % (06/01 1500) FiO2 (%):  [60 %] 60 % (06/01 1445) Weight:  [103.4 kg] 103.4 kg (06/01 1445)  GENERAL:Age appropriate on MV HEAD: Normocephalic, atraumatic.  EYES: Pupils equal, round, reactive to light.  No scleral icterus.  MOUTH: Moist mucosal membrane. NECK: Supple. No thyromegaly. No nodules. No JVD.  PULMONARY: mechanical ventilator sounds CARDIOVASCULAR: S1 and S2. Regular rate and rhythm. No murmurs, rubs, or gallops.  GASTROINTESTINAL: Soft, nontender, non-distended. No masses. Positive bowel sounds. No hepatosplenomegaly.  MUSCULOSKELETAL: No swelling, clubbing, or edema.  NEUROLOGIC: Mild distress due to acute illness SKIN:intact,warm,dry   PERTINENT DATA     Infusions:  sodium chloride 10 mL/hr at 07/22/22 1501   propofol (DIPRIVAN) infusion 48.356 mcg/kg/min (07/22/22 1449)   Scheduled Medications:  PRN Medications:  Hemodynamic parameters:   Intake/Output: No intake/output data recorded.  Ventilator  Settings: Vent Mode: AC FiO2 (%):  [  60 %] 60 % Set Rate:  [20 bmp] 20 bmp Vt Set:  [500 mL] 500 mL PEEP:  [5 cmH20] 5 cmH20   LAB  RESULTS:  Basic Metabolic Panel: No results for input(s): "NA", "K", "CL", "CO2", "GLUCOSE", "BUN", "CREATININE", "CALCIUM", "MG", "PHOS" in the last 168 hours. Liver Function Tests: No results for input(s): "AST", "ALT", "ALKPHOS", "BILITOT", "PROT", "ALBUMIN" in the last 168 hours. No results for input(s): "LIPASE", "AMYLASE" in the last 168 hours. No results for input(s): "AMMONIA" in the last 168 hours. CBC: Recent Labs  Lab 07/22/22 1447  WBC 10.9*  NEUTROABS 5.1  HGB 16.3  HCT 48.3  MCV 88.0  PLT 287   Cardiac Enzymes: No results for input(s): "CKTOTAL", "CKMB", "CKMBINDEX", "TROPONINI" in the last 168 hours. BNP: Invalid input(s): "POCBNP" CBG: No results for input(s): "GLUCAP" in the last 168 hours.     IMAGING RESULTS:     ASSESSMENT AND PLAN      Acute Anterior Myocardial infarction  -s/p EKG and TTE -cardiology on case - Dr Kirke Corin appreciate input - s/p left heart cath with stenting of PCI --supportive care and ICU telemetry monitoring.    GI/Nutrition GI PROPHYLAXIS as indicated DIET-->TF's as tolerated Constipation protocol as indicated  ENDO - ICU hypoglycemic\Hyperglycemia protocol -check FSBS per protocol   ELECTROLYTES -follow labs as needed -replace as needed -pharmacy consultation   DVT/GI PRX ordered -SCDs  TRANSFUSIONS AS NEEDED MONITOR FSBS ASSESS the need for LABS as needed    Critical care provider statement:   Total critical care time: 33 minutes   Performed by: Karna Christmas MD   Critical care time was exclusive of separately billable procedures and treating other patients.   Critical care was necessary to treat or prevent imminent or life-threatening deterioration.   Critical care was time spent personally by me on the following activities: development of treatment plan with patient and/or surrogate as well as nursing, discussions with consultants, evaluation of patient's response to treatment, examination of  patient, obtaining history from patient or surrogate, ordering and performing treatments and interventions, ordering and review of laboratory studies, ordering and review of radiographic studies, pulse oximetry and re-evaluation of patient's condition.    Vida Rigger, M.D.  Pulmonary & Critical Care Medicine

## 2022-07-22 NOTE — ED Notes (Signed)
50mg  propofol bolus administered.

## 2022-07-22 NOTE — ED Notes (Signed)
20 mg bolus of propofol administered

## 2022-07-22 NOTE — Consult Note (Signed)
Cardiology Consultation   Patient ID: DOY KOPKA MRN: [id number]; DOB: Jul 12, 1975  Admit date: 07/22/2022 Date of Consult: 07/22/2022  PCP:  Patient, No Pcp Per   [facility] Providers Cardiologist:  None   new     Patient Profile:   Terrance Thompson is a 47 y.o. male with a hx of tobacco use and occasional cocaine use who is being seen 07/22/2022 for the evaluation of out of hospital cardiac arrest at the request of Dr. Cyril Loosen.  History of Present Illness:   Mr. Terrance Thompson is a 47 year old male who does not follow-up with any primary care physician.  History is provided by the family and EMS as the patient is intubated and sedated.  The family reports that the patient had exertional chest pain for more than a year but did not seek medical attention.  They were sitting in the house today and he was on the couch when he started having loud snoring noises followed by loss of consciousness.  Family called 911 and started CPR.  EMS arrived and the patient was in ventricular fibrillation.  He was shocked with restoration of ROSC relatively quickly but the patient was agitated.  Initial EKG did not show clear ST elevation.  He arrived to the ED and he was hypertensive.  An EKG was done and showed evidence of anterior STEMI.  Given his agitation, he had to be intubated for airway protection.  I talked with the family and recommended proceeding with emergent cardiac catheterization and possible PCI. According to the family, he does not take any prescribed medications at home.  No past medical history on file.  No past surgical history on file.     Inpatient Medications: Scheduled Meds:  [START ON 07/23/2022] aspirin  81 mg Oral Daily   [START ON 07/23/2022] Chlorhexidine Gluconate Cloth  6 each Topical Q0600   mouth rinse  15 mL Mouth Rinse Q2H   pantoprazole (PROTONIX) IV  40 mg Intravenous Q24H   ticagrelor  90 mg Oral BID   Continuous Infusions:  sodium chloride 10 mL/hr at 07/22/22 1501    fentaNYL infusion INTRAVENOUS     propofol (DIPRIVAN) infusion 40 mcg/kg/min (07/22/22 1621)   PRN Meds: mouth rinse  Allergies:   No Known Allergies  Social History:   Social History   Socioeconomic History   Marital status: Single    Spouse name: Not on file   Number of children: Not on file   Years of education: Not on file   Highest education level: Not on file  Occupational History   Not on file  Tobacco Use   Smoking status: Former   Smokeless tobacco: Former  Substance and Sexual Activity   Alcohol use: Not on file   Drug use: Not on file   Sexual activity: Not on file  Other Topics Concern   Not on file  Social History Narrative   Not on file   Social Determinants of Health   Financial Resource Strain: Not on file  Food Insecurity: Not on file  Transportation Needs: Not on file  Physical Activity: Not on file  Stress: Not on file  Social Connections: Not on file  Intimate Partner Violence: Not on file    Family History:   Not able to obtain as the patient is intubated and sedated.  ROS:  Not able to obtain as the patient is intubated and sedated.  Physical Exam/Data:   Vitals:   07/22/22 1607 07/22/22 1612 07/22/22 1617  07/22/22 1642  BP: (!) 137/104 (!) 121/107 (!) 133/98 106/71  Pulse: 89 88 86 87  Resp: (!) 23 (!) 27 (!) 26 (!) 22  Temp:    98.4 F (36.9 C)  SpO2: 100% 100% 100% 99%  Weight:    97.2 kg  Height:        Intake/Output Summary (Last 24 hours) at 07/22/2022 1654 Last data filed at 07/22/2022 1621 Gross per 24 hour  Intake 800 ml  Output 950 ml  Net -150 ml      07/22/2022    4:42 PM 07/22/2022    2:45 PM 02/17/2018    9:23 AM  Last 3 Weights  Weight (lbs) 214 lb 4.6 oz 227 lb 15.3 oz 228 lb  Weight (kg) 97.2 kg 103.4 kg 103.42 kg     Body mass index is 29.06 kg/m.  General: Intubated and sedated HEENT: normal Neck: no JVD Vascular: No carotid bruits; Distal pulses 2+ bilaterally Cardiac:  normal S1, S2; RRR; no  murmur  Lungs:  clear to auscultation bilaterally, no wheezing, rhonchi or rales  Abd: soft, nontender, no hepatomegaly  Ext: no edema Musculoskeletal:  No deformities, BUE and BLE strength normal and equal Skin: warm and dry  Neuro: Nonfocal Psych: Not able to evaluate  EKG:  The EKG was personally reviewed and demonstrates: Sinus tachycardia with anterior ST elevation starting from V3 to V6. Telemetry:  Telemetry was personally reviewed and demonstrates:    Relevant CV Studies:   Laboratory Data:  High Sensitivity Troponin:   Recent Labs  Lab 07/22/22 1447  TROPONINIHS 276*     Chemistry Recent Labs  Lab 07/22/22 1447  NA 139  K 3.6  CL 108  CO2 22  GLUCOSE 149*  BUN 14  CREATININE 1.06  CALCIUM 9.1  GFRNONAA >60  ANIONGAP 9    Recent Labs  Lab 07/22/22 1447  PROT 7.5  ALBUMIN 4.3  AST 51*  ALT 36  ALKPHOS 84  BILITOT 0.7   Lipids  Recent Labs  Lab 07/22/22 1447  CHOL 204*  TRIG 171*  HDL 36*  LDLCALC 134*  CHOLHDL 5.7    Hematology Recent Labs  Lab 07/22/22 1447  WBC 10.9*  RBC 5.49  HGB 16.3  HCT 48.3  MCV 88.0  MCH 29.7  MCHC 33.7  RDW 14.1  PLT 287   Thyroid No results for input(s): "TSH", "FREET4" in the last 168 hours.  BNPNo results for input(s): "BNP", "PROBNP" in the last 168 hours.  DDimer No results for input(s): "DDIMER" in the last 168 hours.   Radiology/Studies:  CARDIAC CATHETERIZATION  Result Date: 07/22/2022   Prox RCA lesion is 90% stenosed.   Prox RCA to Mid RCA lesion is 99% stenosed.   RV Branch lesion is 99% stenosed.   1st Mrg lesion is 60% stenosed.   Mid Cx lesion is 40% stenosed.   Prox LAD lesion is 100% stenosed.   Dist Cx lesion is 70% stenosed.   3rd Mrg lesion is 40% stenosed.   A drug-eluting stent was successfully placed using a STENT ONYX FRONTIER 3.5X18.   Post intervention, there is a 0% residual stenosis.   There is moderate to severe left ventricular systolic dysfunction.   LV end diastolic pressure  is severely elevated.   The left ventricular ejection fraction is 25-35% by visual estimate. 1.  Out of hospital cardiac arrest due to anterior STEMI. 2.  Left dominant coronary arteries with significant three-vessel coronary artery disease.  The  culprit is thrombotic occlusion of the proximal LAD which was treated successfully with PCI and drug-eluting stent placement.  There is 70% stenosis in the distal left circumflex at the bifurcation of OM 3 which likely can be treated medically.  The RCA is subtotally occluded but it is small and nondominant. 3.  Moderately to severely reduced LV systolic function with severely elevated left ventricular end-diastolic pressure. Recommendations: Continue dual antiplatelet therapy for at least 12 months. Continue cangrelor infusion for another 3 hours. The patient was given 1 dose of IV furosemide with excellent urine output. Vent management per critical care.   DG Abdomen 1 View  Result Date: 07/22/2022 CLINICAL DATA:  Evaluate OG tube. EXAM: ABDOMEN - 1 VIEW COMPARISON:  None Available. FINDINGS: The distal tip of the OG tube is approximally 2.5 cm above the GE junction. IMPRESSION: The distal tip of the OG tube is approximately 2.5 cm above the GE junction. Recommend advancing approximately 13 cm. Electronically Signed   By: Gerome Sam III M.D.   On: 07/22/2022 15:19   DG Chest Port 1 View  Result Date: 07/22/2022 CLINICAL DATA:  Post intubation.  OG tube placement. EXAM: PORTABLE CHEST 1 VIEW COMPARISON:  None Available. FINDINGS: The ETT terminates 1.7 cm proximal above the carina. Recommend withdrawing 1 cm. The NG tube is not identified on this chest x-ray. The heart, hila, mediastinum are normal. Bilateral pulmonary opacities could represent edema versus multifocal infiltrate/infection. Recommend clinical correlation. No other abnormalities. IMPRESSION: 1. The ETT terminates 1.7 cm proximal to the carina. Recommend withdrawing 1 cm. 2. The NG tube is not  identified on this chest x-ray. Recommend clinical correlation. 3. Bilateral pulmonary opacities could represent edema versus multifocal infiltrate/infection. Electronically Signed   By: Gerome Sam III M.D.   On: 07/22/2022 15:19     Assessment and Plan:   Out of hospital cardiac arrest due to anterior STEMI: Emergent cardiac catheterization was performed via the right radial artery which showed thrombotic occlusion of the proximal LAD which is the likely culprit.  This was treated successfully with PCI and drug-eluting stent placement.  He does have residual disease affecting the distal left circumflex and right coronary artery which is nondominant.  This will be treated medically.  Continue dual antiplatelet therapy for at least 12 months.  Continue cangrelor infusion for 3 hours.   Acute systolic heart failure due to anterior myocardial infarction: EF is 25 to 35% by left ventricular angiography.  LVEDP was 34.  The patient was given 1 dose of IV furosemide with excellent urine output.  Hopefully he does not require further diuresis.  I started small dose carvedilol and requested an echocardiogram. Hyperlipidemia: I started high-dose atorvastatin. Tobacco use and cocaine use: I discussed with the family the importance of abstinence. Vent management per critical care.  Hypoxia improved in the Cath Lab with diuresis.  CRITICAL CARE Performed by: Lorine Bears   Total critical care time: 60 minutes  Critical care time was exclusive of separately billable procedures and treating other patients.  Critical care was necessary to treat or prevent imminent or life-threatening deterioration.  Critical care was time spent personally by me on the following activities: development of treatment plan with patient and/or surrogate as well as nursing, discussions with consultants, evaluation of patient's response to treatment, examination of patient, obtaining history from patient or surrogate, ordering  and performing treatments and interventions, ordering and review of laboratory studies, ordering and review of radiographic studies, pulse oximetry and re-evaluation of  patient's condition.    Risk Assessment/Risk Scores:     TIMI Risk Score for ST  Elevation MI:   The patient's TIMI risk score is 6, which indicates a 16.1% risk of all cause mortality at 30 days.{   For questions or updates, please contact [facility] Please consult [email or web address] for contact info under    Signed, Lorine Bears, MD  07/22/2022 4:54 PM

## 2022-07-22 NOTE — Progress Notes (Signed)
   07/22/22 1500  Spiritual Encounters  Type of Visit Initial  Care provided to: Pt not available  Conversation partners present during encounter Nurse  Referral source Code page  Reason for visit Code  OnCall Visit Yes   Chaplain arrived in the ED for a code STEMI. Medical team were still working with the patient when I arrived. I went to the front to see if any family was present and there was not. Informed nursing staff to page me if/when the family arrives for the family or if they need me to come back to the ED.

## 2022-07-22 NOTE — ED Triage Notes (Signed)
Pt arrived via EMS post cardiac arrest. Per EMS patient went pulseless in front of family but did not initiate CPR, EMS began CPR on arrival. Pt received 3 rounds of CPR, 3 defib, 300 mg of amiodarone, 1 of epi. Pt also received fent and 800 mL NS. EMS states 3 min prior to arrival pt was yelling and not following any commands. Pt pupils round and reactive. Per EMS pt has hx of marijuana and cocaine use.

## 2022-07-22 NOTE — Progress Notes (Signed)
Pt transported to Cath Lab from ER 3 on trilogy vent and then transported to ICU from Cath lab after procedure with no complications.

## 2022-07-22 NOTE — ED Provider Notes (Signed)
[facility] Provider Note    Event Date/Time   First MD Initiated Contact with Patient 07/22/22 1447     (approximate)   History   Postarrest   HPI  Terrance Thompson is a 47 y.o. male who presents after reported witnessed arrest in front of family.  EMS reports V-fib, shocked 3 times, amiodarone, repeat return of spontaneous circulation.  Upon arrival patient moving somewhat wildly, moaning but very altered not spontaneously opening eyes.     Physical Exam   Triage Vital Signs: ED Triage Vitals [07/22/22 1445]  Enc Vitals Group     BP      Pulse      Resp      Temp      Temp src      SpO2      Weight 103.4 kg (227 lb 15.3 oz)     Height 1.829 m (6')     Head Circumference      Peak Flow      Pain Score      Pain Loc      Pain Edu?      Excl. in GC?     Most recent vital signs: There were no vitals filed for this visit.   General: Moaning, moving all over the stretcher not spontaneously opening eyes CV:  Good peripheral perfusion.  Tachycardia Resp:  Tachypnea Abd:  No distention.  Other:     ED Results / Procedures / Treatments   Labs (all labs ordered are listed, but only abnormal results are displayed) Labs Reviewed  CBC WITH DIFFERENTIAL/PLATELET - Abnormal; Notable for the following components:      Result Value   WBC 10.9 (*)    Lymphs Abs 4.3 (*)    Monocytes Absolute 1.2 (*)    All other components within normal limits  COMPREHENSIVE METABOLIC PANEL  PROTIME-INR  APTT  LIPID PANEL  URINE DRUG SCREEN, QUALITATIVE ([facility] ONLY)  TROPONIN I (HIGH SENSITIVITY)     EKG  ED ECG REPORT I, Jene Every, the attending physician, personally viewed and interpreted this ECG.  Date: 07/22/2022  Rhythm: normal sinus rhythm QRS Axis: normal Intervals: normal ST/T Wave abnormalities: Elevations V2 V3 V4 Narrative Interpretation: Anterior STEMI Code STEMI activated at 2:45 PM    RADIOLOGY X-ray viewed interpreted by me  consistent with pulmonary edema    PROCEDURES:  Critical Care performed: yes  CRITICAL CARE Performed by: Jene Every   Total critical care time: 30 minutes  Critical care time was exclusive of separately billable procedures and treating other patients.  Critical care was necessary to treat or prevent imminent or life-threatening deterioration.  Critical care was time spent personally by me on the following activities: development of treatment plan with patient and/or surrogate as well as nursing, discussions with consultants, evaluation of patient's response to treatment, examination of patient, obtaining history from patient or surrogate, ordering and performing treatments and interventions, ordering and review of laboratory studies, ordering and review of radiographic studies, pulse oximetry and re-evaluation of patient's condition.   Procedures   MEDICATIONS ORDERED IN ED: Medications  0.9 %  sodium chloride infusion ( Intravenous New Bag/Given 07/22/22 1501)  propofol (DIPRIVAN) 1000 MG/100ML infusion (10 mcg/kg/min  103.4 kg Intravenous Bolus 07/22/22 1450)  heparin injection 4,000 Units (4,000 Units Intravenous Given 07/22/22 1501)     IMPRESSION / MDM / ASSESSMENT AND PLAN / ED COURSE  I reviewed the triage vital signs and the nursing notes. Patient's  presentation is most consistent with acute presentation with potential threat to life or bodily function.  Patient presents post V-fib arrest, differential includes MI, pulmonary edema/CHF, substance abuse  Decision made to intubate given altered mental status with movement all over stretcher and need to control the situation to help the patient  Intubated without difficulty after etomidate and succinylcholine  Postintubation sedation of 2 mg of Ativan followed by propofol drip  Postintubation EKG consistent with anterior STEMI, code STEMI activated at 2:45 PM  Initial blood pressure was over 200 systolic but improved  after propofol sedation started  Heparin bolus given, IV fluids infusing  ----------------------------------------- 3:15 PM on 07/22/2022 ----------------------------------------- Blood pressure has normalized, heart rate is normalized, patient is calm on the vent, Dr. Kirke Corin will take to the Cath Lab now  Met with family and updated them on patient's status     FINAL CLINICAL IMPRESSION(S) / ED DIAGNOSES   Final diagnoses:  Cardiac arrest (HCC)  Acute ST elevation myocardial infarction (STEMI) of anterior wall (HCC)     Rx / DC Orders   ED Discharge Orders     None        Note:  This document was prepared using Dragon voice recognition software and may include unintentional dictation errors.   Jene Every, MD 07/22/22 1515

## 2022-07-22 NOTE — Progress Notes (Signed)
   07/22/22 1600  Spiritual Encounters  Conversation partners present during encounter Nurse  Referral source Family  Reason for visit Trauma  OnCall Visit Yes  Spiritual Framework  Presenting Themes Caregiving needs;Courage hope and growth  Community/Connection Family;Significant other  Family Stress Factors None identified  Interventions  Spiritual Care Interventions Made Reflective listening;Compassionate presence;Explored values/beliefs/practices/strengths;Prayer;Encouragement  Intervention Outcomes  Outcomes Awareness around self/spiritual resourses  Spiritual Care Plan  Spiritual Care Issues Still Outstanding Chaplain will continue to follow;Referring to oncoming chaplain for further support   Chaplain received a page to speak with the family of the patient. Met family in Cath Lab waiting room. Talked to the patients mother, father, son and girlfriend. Girlfriend shared that the patient quit breathing on the couch and she performed CPR. She stated she was very scared. Family asked me if I would pray with them and I did. Family also shared stories with me about the patient and I offered words of hope and encouragement. Sat with family until patient was out of surgery. Informed family if they need me to have the medical staff contact chaplain services and I will meet them in the ICU.

## 2022-07-22 NOTE — ED Notes (Signed)
100 mg of succ given

## 2022-07-22 NOTE — ED Notes (Signed)
10mg  propofol bolus administered.

## 2022-07-22 NOTE — ED Notes (Signed)
30mg  propofol bolus administered per MD Cyril Loosen

## 2022-07-22 NOTE — ED Notes (Signed)
20 mg of propofol administered

## 2022-07-23 ENCOUNTER — Other Ambulatory Visit: Payer: Self-pay

## 2022-07-23 LAB — CBC
HCT: 45 % (ref 39.0–52.0)
Hemoglobin: 15.3 g/dL (ref 13.0–17.0)
MCH: 29.4 pg (ref 26.0–34.0)
MCHC: 34 g/dL (ref 30.0–36.0)
MCV: 86.4 fL (ref 80.0–100.0)
Platelets: 252 10*3/uL (ref 150–400)
RBC: 5.21 MIL/uL (ref 4.22–5.81)
RDW: 14.5 % (ref 11.5–15.5)
WBC: 9.2 10*3/uL (ref 4.0–10.5)
nRBC: 0 % (ref 0.0–0.2)

## 2022-07-23 LAB — GLUCOSE, CAPILLARY
Glucose-Capillary: 99 mg/dL (ref 70–99)
Glucose-Capillary: 96 mg/dL (ref 70–99)
Glucose-Capillary: 108 mg/dL — ABNORMAL HIGH (ref 70–99)
Glucose-Capillary: 113 mg/dL — ABNORMAL HIGH (ref 70–99)
Glucose-Capillary: 134 mg/dL — ABNORMAL HIGH (ref 70–99)

## 2022-07-23 LAB — BASIC METABOLIC PANEL
Anion gap: 11 (ref 5–15)
BUN: 19 mg/dL (ref 6–20)
CO2: 21 mmol/L — ABNORMAL LOW (ref 22–32)
Calcium: 8.7 mg/dL — ABNORMAL LOW (ref 8.9–10.3)
Chloride: 107 mmol/L (ref 98–111)
Creatinine, Ser: 1.16 mg/dL (ref 0.61–1.24)
GFR, Estimated: >60 mL/min (ref >60)
Glucose, Bld: 98 mg/dL (ref 70–99)
Potassium: 3.5 mmol/L (ref 3.5–5.1)
Sodium: 139 mmol/L (ref 135–145)

## 2022-07-23 LAB — ECHOCARDIOGRAM COMPLETE
AR max vel: 3.16 cm2
AV Area VTI: 3.71 cm2
AV Area mean vel: 3.11 cm2
AV Mean grad: 4 mmHg
AV Peak grad: 7.6 mmHg
Ao pk vel: 1.38 m/s
Area-P 1/2: 4.52 cm2
Height: 72 in
S' Lateral: 3.8 cm
Weight: 3428.59 oz

## 2022-07-23 LAB — HEPATIC FUNCTION PANEL
ALT: 75 U/L — ABNORMAL HIGH (ref 0–44)
AST: 240 U/L — ABNORMAL HIGH (ref 15–41)
Albumin: 3.9 g/dL (ref 3.5–5.0)
Alkaline Phosphatase: 75 U/L (ref 38–126)
Bilirubin, Direct: 0.2 mg/dL (ref 0.0–0.2)
Indirect Bilirubin: 0.5 mg/dL (ref 0.3–0.9)
Total Bilirubin: 0.7 mg/dL (ref 0.3–1.2)
Total Protein: 7 g/dL (ref 6.5–8.1)

## 2022-07-23 LAB — PHOSPHORUS: Phosphorus: 4.9 mg/dL — ABNORMAL HIGH (ref 2.5–4.6)

## 2022-07-23 LAB — PROCALCITONIN: Procalcitonin: 0.19 ng/mL

## 2022-07-23 LAB — MAGNESIUM: Magnesium: 2 mg/dL (ref 1.7–2.4)

## 2022-07-23 MED ORDER — TICAGRELOR 90 MG PO TABS
90.0000 mg | ORAL_TABLET | Freq: Two times a day (BID) | ORAL | Status: DC
  Administered 2022-07-23 – 2022-07-25 (×4): 90 mg via ORAL
  Filled 2022-07-23 (×4): qty 1

## 2022-07-23 MED ORDER — ACETAMINOPHEN 325 MG PO TABS
650.0000 mg | ORAL_TABLET | Freq: Four times a day (QID) | ORAL | Status: DC | PRN
  Filled 2022-07-23: qty 2

## 2022-07-23 MED ORDER — POTASSIUM CHLORIDE 20 MEQ PO PACK
40.0000 meq | PACK | Freq: Once | ORAL | Status: AC
  Administered 2022-07-23: 40 meq
  Filled 2022-07-23: qty 2

## 2022-07-23 MED ORDER — ORAL CARE MOUTH RINSE
15.0000 mL | Freq: Four times a day (QID) | OROMUCOSAL | Status: DC
  Administered 2022-07-23: 15 mL via OROMUCOSAL

## 2022-07-23 MED ORDER — ACETAMINOPHEN 325 MG PO TABS: 650.0000 mg | ORAL_TABLET | ORAL | Status: DC | PRN

## 2022-07-23 MED ORDER — FENTANYL BOLUS VIA INFUSION
50.0000 ug | INTRAVENOUS | Status: DC | PRN
  Administered 2022-07-22 – 2022-07-23 (×3): 100 ug via INTRAVENOUS

## 2022-07-23 MED ORDER — ASPIRIN 81 MG PO CHEW: 81.0000 mg | CHEWABLE_TABLET | Freq: Every day | ORAL | Status: DC

## 2022-07-23 MED ORDER — ATORVASTATIN CALCIUM 80 MG PO TABS
80.0000 mg | ORAL_TABLET | Freq: Every day | ORAL | Status: DC
  Administered 2022-07-24 – 2022-07-25 (×2): 80 mg via ORAL
  Filled 2022-07-23: qty 1
  Filled 2022-07-23: qty 4

## 2022-07-23 MED ORDER — FAMOTIDINE 20 MG PO TABS
20.0000 mg | ORAL_TABLET | Freq: Two times a day (BID) | ORAL | Status: DC
  Administered 2022-07-24 (×2): 20 mg via ORAL
  Filled 2022-07-23 (×3): qty 1

## 2022-07-23 MED ORDER — CARVEDILOL 3.125 MG PO TABS
3.1250 mg | ORAL_TABLET | Freq: Two times a day (BID) | ORAL | Status: DC
  Administered 2022-07-24: 3.125 mg via ORAL
  Filled 2022-07-23: qty 1

## 2022-07-23 MED ORDER — SODIUM CHLORIDE 0.9 % IV SOLN
3.0000 g | Freq: Four times a day (QID) | INTRAVENOUS | Status: DC
  Administered 2022-07-23 – 2022-07-25 (×7): 3 g via INTRAVENOUS
  Filled 2022-07-23 (×8): qty 8

## 2022-07-23 MED ORDER — ASPIRIN 81 MG PO CHEW
81.0000 mg | CHEWABLE_TABLET | Freq: Every day | ORAL | Status: DC
  Administered 2022-07-24 – 2022-07-25 (×2): 81 mg via ORAL
  Filled 2022-07-23 (×2): qty 1

## 2022-07-23 MED ORDER — DOCUSATE SODIUM 50 MG/5ML PO LIQD: 100.0000 mg | Freq: Two times a day (BID) | ORAL | Status: DC

## 2022-07-23 MED ORDER — DEXMEDETOMIDINE HCL IN NACL 400 MCG/100ML IV SOLN
0.0000 ug/kg/h | INTRAVENOUS | Status: DC
  Administered 2022-07-23: 0.6 ug/kg/h via INTRAVENOUS
  Administered 2022-07-23: 0.8 ug/kg/h via INTRAVENOUS
  Administered 2022-07-23: 0.4 ug/kg/h via INTRAVENOUS
  Administered 2022-07-24: 0.6 ug/kg/h via INTRAVENOUS
  Filled 2022-07-23 (×4): qty 100

## 2022-07-23 MED ORDER — ACETAMINOPHEN 10 MG/ML IV SOLN
1000.0000 mg | Freq: Two times a day (BID) | INTRAVENOUS | Status: DC
  Administered 2022-07-23: 1000 mg via INTRAVENOUS
  Filled 2022-07-23 (×2): qty 100

## 2022-07-23 MED ORDER — ORAL CARE MOUTH RINSE: 15.0000 mL | OROMUCOSAL | Status: DC | PRN

## 2022-07-23 NOTE — Progress Notes (Addendum)
Rounding Note    Patient Name: Terrance Thompson Date of Encounter: 07/23/2022  [facility] Cardiologist: None   Subjective   Sleepy.  Denies chest pain.,  Inpatient Medications    Scheduled Meds:  [START ON 07/24/2022] aspirin  81 mg Per Tube Daily   atorvastatin  80 mg Per Tube Daily   carvedilol  3.125 mg Per Tube BID WC   Chlorhexidine Gluconate Cloth  6 each Topical Q0600   docusate  100 mg Per Tube BID   enoxaparin (LOVENOX) injection  40 mg Subcutaneous Q24H   famotidine  20 mg Per Tube BID   mouth rinse  15 mL Mouth Rinse QID   pantoprazole (PROTONIX) IV  40 mg Intravenous Q24H   polyethylene glycol  17 g Per Tube Daily   sodium chloride flush  3 mL Intravenous Q12H   ticagrelor  90 mg Per Tube BID   Continuous Infusions:  sodium chloride 10 mL/hr at 07/23/22 1000   sodium chloride     sodium chloride     acetaminophen     dexmedetomidine (PRECEDEX) IV infusion 0.8 mcg/kg/hr (07/23/22 1000)   fentaNYL infusion INTRAVENOUS Stopped (07/23/22 0902)   norepinephrine (LEVOPHED) Adult infusion     PRN Meds: sodium chloride, acetaminophen, midazolam, ondansetron (ZOFRAN) IV, mouth rinse, sodium chloride flush   Vital Signs    Vitals:   07/23/22 0921 07/23/22 0925 07/23/22 1000 07/23/22 1100  BP:   111/66 95/64  Pulse:  88 89 76  Resp:  (!) 26 16 (!) 24  Temp:  (!) 100.8 F (38.2 C) (!) 101.5 F (38.6 C) (!) 101.3 F (38.5 C)  TempSrc:      SpO2: 99% 97% 95% 96%  Weight:      Height:        Intake/Output Summary (Last 24 hours) at 07/23/2022 1132 Last data filed at 07/23/2022 1000 Gross per 24 hour  Intake 1803.66 ml  Output 1895 ml  Net -91.34 ml      07/22/2022    4:42 PM 07/22/2022    2:45 PM 02/17/2018    9:23 AM  Last 3 Weights  Weight (lbs) 214 lb 4.6 oz 227 lb 15.3 oz 228 lb  Weight (kg) 97.2 kg 103.4 kg 103.42 kg      Telemetry    Sinus rhythm.  PVCs.- Personally Reviewed  ECG    07/22/2022: Sinus rhythm.  Rate 94 beats minute.   Anterior ST elevation. - Personally Reviewed  Physical Exam   VS:  BP 95/64   Pulse 76   Temp (!) 101.3 F (38.5 C)   Resp (!) 24   Ht 6' (1.829 m)   Wt 97.2 kg   SpO2 96%   BMI 29.06 kg/m  , BMI Body mass index is 29.06 kg/m. GENERAL:  Confused.  Sleepy HEENT: Pupils equal round and reactive, fundi not visualized, oral mucosa unremarkable NECK:  No jugular venous distention, waveform within normal limits, carotid upstroke brisk and symmetric, no bruits, no thyromegal LUNGS:  Clear to auscultation bilaterally HEART:  RRR.  PMI not displaced or sustained,S1 and S2 within normal limits, no S3, no S4, no clicks, no rubs, no murmurs ABD:  Flat, positive bowel sounds normal in frequency in pitch, no bruits, no rebound, no guarding, no midline pulsatile mass, no hepatomegaly, no splenomegaly EXT:  2 plus pulses throughout, no edema, no cyanosis no clubbing SKIN:  No rashes no nodules NEURO:  Cranial nerves II through XII grossly intact, motor grossly  intact throughout [facility]:  Cognitively intact, oriented to person place and time   Labs    High Sensitivity Troponin:   Recent Labs  Lab 07/22/22 1447 07/22/22 1645  TROPONINIHS 276* >24,000*     Chemistry Recent Labs  Lab 07/22/22 1447 07/22/22 1814 07/23/22 0320  NA 139 138 139  K 3.6 3.6 3.5  CL 108 106 107  CO2 22 22 21*  GLUCOSE 149* 104* 98  BUN 14 16 19   CREATININE 1.06 1.01 1.16  CALCIUM 9.1 8.8* 8.7*  MG  --  2.3 2.0  PROT 7.5 7.4 7.0  ALBUMIN 4.3 4.3 3.9  AST 51* 249* 240*  ALT 36 68* 75*  ALKPHOS 84 83 75  BILITOT 0.7 1.0 0.7  GFRNONAA >60 >60 >60  ANIONGAP 9 10 11     Lipids  Recent Labs  Lab 07/22/22 1447  CHOL 204*  TRIG 171*  HDL 36*  LDLCALC 134*  CHOLHDL 5.7    Hematology Recent Labs  Lab 07/22/22 1447 07/23/22 0320  WBC 10.9* 9.2  RBC 5.49 5.21  HGB 16.3 15.3  HCT 48.3 45.0  MCV 88.0 86.4  MCH 29.7 29.4  MCHC 33.7 34.0  RDW 14.1 14.5  PLT 287 252   Thyroid No results for  input(s): "TSH", "FREET4" in the last 168 hours.  BNPNo results for input(s): "BNP", "PROBNP" in the last 168 hours.  DDimer No results for input(s): "DDIMER" in the last 168 hours.   Radiology    ECHOCARDIOGRAM COMPLETE  Result Date: 07/23/2022    ECHOCARDIOGRAM REPORT   Patient Name:   Terrance Thompson Date of Exam: 07/23/2022 Medical Rec #:  [id number]    Height:       72.0 in Accession #:    [id number]   Weight:       214.3 lb Date of Birth:  18-Jan-1976     BSA:          2.194 m Patient Age:    47 years     BP:           193/127 mmHg Patient Gender: M            HR:           86 bpm. Exam Location:  [facility] Procedure: 2D Echo, Cardiac Doppler and Color Doppler Indications:    Acute myocardial infarction I21.9  History:        Patient has no prior history of Echocardiogram examinations.                 Acute MI; Cocaine abuse.  Sonographer:    Dondra Prader RVT RCS Referring Phys: [id number] MUHAMMAD A ARIDA  Sonographer Comments: Technically challenging due to patient on vent and unable to reposition IMPRESSIONS  1. There is moderate septal hypertrophy with otherwise mild concentric LVH. Left ventricular ejection fraction, by estimation, is 35 to 40%. The left ventricle has moderately decreased function. The left ventricle demonstrates regional wall motion abnormalities (see scoring diagram/findings for description). There is moderate left ventricular hypertrophy of the septal segment. Left ventricular diastolic parameters are consistent with Grade I diastolic dysfunction (impaired relaxation).  2. Right ventricular systolic function is normal. The right ventricular size is normal. There is normal pulmonary artery systolic pressure.  3. Left atrial size was mildly dilated.  4. The mitral valve is normal in structure. Trivial mitral valve regurgitation. No evidence of mitral stenosis.  5. The aortic valve is tricuspid. Aortic valve regurgitation is not visualized. No aortic  stenosis is present.  6. The inferior vena cava is  normal in size with greater than 50% respiratory variability, suggesting right atrial pressure of 3 mmHg. FINDINGS  Left Ventricle: There is moderate septal hypertrophy with otherwise mild concentric LVH. Left ventricular ejection fraction, by estimation, is 35 to 40%. The left ventricle has moderately decreased function. The left ventricle demonstrates regional wall  motion abnormalities. The left ventricular internal cavity size was normal in size. There is moderate left ventricular hypertrophy of the septal segment. Left ventricular diastolic parameters are consistent with Grade I diastolic dysfunction (impaired relaxation). Normal left ventricular filling pressure.  LV Wall Scoring: The entire anterior wall, anterior septum, and apex are hypokinetic. The entire lateral wall, inferior septum, and entire inferior wall are normal. Right Ventricle: The right ventricular size is normal. No increase in right ventricular wall thickness. Right ventricular systolic function is normal. There is normal pulmonary artery systolic pressure. The tricuspid regurgitant velocity is 1.33 m/s, and  with an assumed right atrial pressure of 3 mmHg, the estimated right ventricular systolic pressure is 10.1 mmHg. Left Atrium: Left atrial size was mildly dilated. Right Atrium: Right atrial size was normal in size. Pericardium: There is no evidence of pericardial effusion. Mitral Valve: The mitral valve is normal in structure. Trivial mitral valve regurgitation. No evidence of mitral valve stenosis. Tricuspid Valve: The tricuspid valve is normal in structure. Tricuspid valve regurgitation is trivial. No evidence of tricuspid stenosis. Aortic Valve: The aortic valve is tricuspid. Aortic valve regurgitation is not visualized. No aortic stenosis is present. Aortic valve mean gradient measures 4.0 mmHg. Aortic valve peak gradient measures 7.6 mmHg. Aortic valve area, by VTI measures 3.71 cm. Pulmonic Valve: The pulmonic valve was normal in  structure. Pulmonic valve regurgitation is not visualized. No evidence of pulmonic stenosis. Aorta: The aortic root is normal in size and structure. Venous: The inferior vena cava is normal in size with greater than 50% respiratory variability, suggesting right atrial pressure of 3 mmHg. IAS/Shunts: No atrial level shunt detected by color flow Doppler.  LEFT VENTRICLE PLAX 2D LVIDd:         4.65 cm   Diastology LVIDs:         3.80 cm   LV e' medial:    5.66 cm/s LV PW:         1.00 cm   LV E/e' medial:  7.3 LV IVS:        1.25 cm   LV e' lateral:   4.46 cm/s LVOT diam:     2.10 cm   LV E/e' lateral: 9.3 LV SV:         67 LV SV Index:   30 LVOT Area:     3.46 cm  RIGHT VENTRICLE RV Basal diam:  2.40 cm RV S prime:     14.40 cm/s TAPSE (M-mode): 1.7 cm LEFT ATRIUM             Index        RIGHT ATRIUM           Index LA diam:        3.60 cm 1.64 cm/m   RA Area:     10.50 cm LA Vol (A2C):   57.0 ml 25.98 ml/m  RA Volume:   18.60 ml  8.48 ml/m LA Vol (A4C):   72.8 ml 33.18 ml/m LA Biplane Vol: 64.6 ml 29.44 ml/m  AORTIC VALVE  PULMONIC VALVE AV Area (Vmax):    3.16 cm     PV Vmax:       0.89 m/s AV Area (Vmean):   3.11 cm     PV Peak grad:  3.2 mmHg AV Area (VTI):     3.71 cm AV Vmax:           138.00 cm/s AV Vmean:          90.500 cm/s AV VTI:            0.180 m AV Peak Grad:      7.6 mmHg AV Mean Grad:      4.0 mmHg LVOT Vmax:         126.00 cm/s LVOT Vmean:        81.200 cm/s LVOT VTI:          0.193 m LVOT/AV VTI ratio: 1.07  AORTA Ao Root diam: 3.60 cm MITRAL VALVE               TRICUSPID VALVE MV Area (PHT): 4.52 cm    TR Peak grad:   7.1 mmHg MV Decel Time: 168 msec    TR Vmax:        133.00 cm/s MV E velocity: 41.40 cm/s MV A velocity: 83.30 cm/s  SHUNTS MV E/A ratio:  0.50        Systemic VTI:  0.19 m                            Systemic Diam: 2.10 cm Chilton Si MD Electronically signed by Chilton Si MD Signature Date/Time: 07/23/2022/9:28:19 AM    Final    DG Abd 1  View  Result Date: 07/22/2022 CLINICAL DATA:  Evaluate NG tube placement EXAM: ABDOMEN - 1 VIEW COMPARISON:  None Available. FINDINGS: The NG tube terminates in the stomach. IMPRESSION: The NG tube terminates in the stomach. Electronically Signed   By: Gerome Sam III M.D.   On: 07/22/2022 17:52   CARDIAC CATHETERIZATION  Addendum Date: 07/22/2022     Prox RCA lesion is 90% stenosed.   Prox RCA to Mid RCA lesion is 99% stenosed.   RV Branch lesion is 99% stenosed.   1st Mrg lesion is 60% stenosed.   Mid Cx lesion is 40% stenosed.   Prox LAD lesion is 100% stenosed.   Dist Cx lesion is 70% stenosed.   3rd Mrg lesion is 40% stenosed.   A drug-eluting stent was successfully placed using a STENT ONYX FRONTIER 3.5X18.   Post intervention, there is a 0% residual stenosis.   There is moderate to severe left ventricular systolic dysfunction.   LV end diastolic pressure is severely elevated.   The left ventricular ejection fraction is 25-35% by visual estimate. 1.  Out of hospital cardiac arrest due to anterior STEMI. 2.  Left dominant coronary arteries with significant three-vessel coronary artery disease.  The culprit is thrombotic occlusion of the proximal LAD which was treated successfully with PCI and drug-eluting stent placement.  There is 70% stenosis in the distal left circumflex at the bifurcation of OM 3 which likely can be treated medically.  The RCA is subtotally occluded but it is small and nondominant. 3.  Moderately to severely reduced LV systolic function with severely elevated left ventricular end-diastolic pressure. 4.  Delay in door to device time due to the need to stabilize the patient and intubate. Recommendations: Continue dual antiplatelet therapy for at least 12  months. Continue cangrelor infusion for another 3 hours. The patient was given 1 dose of IV furosemide with excellent urine output. Vent management per critical care.  Result Date: 07/22/2022   Prox RCA lesion is 90% stenosed.   Prox  RCA to Mid RCA lesion is 99% stenosed.   RV Branch lesion is 99% stenosed.   1st Mrg lesion is 60% stenosed.   Mid Cx lesion is 40% stenosed.   Prox LAD lesion is 100% stenosed.   Dist Cx lesion is 70% stenosed.   3rd Mrg lesion is 40% stenosed.   A drug-eluting stent was successfully placed using a STENT ONYX FRONTIER 3.5X18.   Post intervention, there is a 0% residual stenosis.   There is moderate to severe left ventricular systolic dysfunction.   LV end diastolic pressure is severely elevated.   The left ventricular ejection fraction is 25-35% by visual estimate. 1.  Out of hospital cardiac arrest due to anterior STEMI. 2.  Left dominant coronary arteries with significant three-vessel coronary artery disease.  The culprit is thrombotic occlusion of the proximal LAD which was treated successfully with PCI and drug-eluting stent placement.  There is 70% stenosis in the distal left circumflex at the bifurcation of OM 3 which likely can be treated medically.  The RCA is subtotally occluded but it is small and nondominant. 3.  Moderately to severely reduced LV systolic function with severely elevated left ventricular end-diastolic pressure. Recommendations: Continue dual antiplatelet therapy for at least 12 months. Continue cangrelor infusion for another 3 hours. The patient was given 1 dose of IV furosemide with excellent urine output. Vent management per critical care.   DG Abdomen 1 View  Result Date: 07/22/2022 CLINICAL DATA:  Evaluate OG tube. EXAM: ABDOMEN - 1 VIEW COMPARISON:  None Available. FINDINGS: The distal tip of the OG tube is approximally 2.5 cm above the GE junction. IMPRESSION: The distal tip of the OG tube is approximately 2.5 cm above the GE junction. Recommend advancing approximately 13 cm. Electronically Signed   By: Gerome Sam III M.D.   On: 07/22/2022 15:19   DG Chest Port 1 View  Result Date: 07/22/2022 CLINICAL DATA:  Post intubation.  OG tube placement. EXAM: PORTABLE CHEST 1 VIEW  COMPARISON:  None Available. FINDINGS: The ETT terminates 1.7 cm proximal above the carina. Recommend withdrawing 1 cm. The NG tube is not identified on this chest x-ray. The heart, hila, mediastinum are normal. Bilateral pulmonary opacities could represent edema versus multifocal infiltrate/infection. Recommend clinical correlation. No other abnormalities. IMPRESSION: 1. The ETT terminates 1.7 cm proximal to the carina. Recommend withdrawing 1 cm. 2. The NG tube is not identified on this chest x-ray. Recommend clinical correlation. 3. Bilateral pulmonary opacities could represent edema versus multifocal infiltrate/infection. Electronically Signed   By: Gerome Sam III M.D.   On: 07/22/2022 15:19    Cardiac Studies   Echo 07/23/22:  IMPRESSIONS    1. There is moderate septal hypertrophy with otherwise mild concentric  LVH. Left ventricular ejection fraction, by estimation, is 35 to 40%. The  left ventricle has moderately decreased function. The left ventricle  demonstrates regional wall motion  abnormalities (see scoring diagram/findings for description). There is  moderate left ventricular hypertrophy of the septal segment. Left  ventricular diastolic parameters are consistent with Grade I diastolic  dysfunction (impaired relaxation).   2. Right ventricular systolic function is normal. The right ventricular  size is normal. There is normal pulmonary artery systolic pressure.   3. Left  atrial size was mildly dilated.   4. The mitral valve is normal in structure. Trivial mitral valve  regurgitation. No evidence of mitral stenosis.   5. The aortic valve is tricuspid. Aortic valve regurgitation is not  visualized. No aortic stenosis is present.   6. The inferior vena cava is normal in size with greater than 50%  respiratory variability, suggesting right atrial pressure of 3 mmHg.   LHC 07/22/22:   Prox RCA lesion is 90% stenosed.   Prox RCA to Mid RCA lesion is 99% stenosed.   RV Branch  lesion is 99% stenosed.   1st Mrg lesion is 60% stenosed.   Mid Cx lesion is 40% stenosed.   Prox LAD lesion is 100% stenosed.   Dist Cx lesion is 70% stenosed.   3rd Mrg lesion is 40% stenosed.   A drug-eluting stent was successfully placed using a STENT ONYX FRONTIER 3.5X18.   Post intervention, there is a 0% residual stenosis.   There is moderate to severe left ventricular systolic dysfunction.   LV end diastolic pressure is severely elevated.   The left ventricular ejection fraction is 25-35% by visual estimate.   1.  Out of hospital cardiac arrest due to anterior STEMI. 2.  Left dominant coronary arteries with significant three-vessel coronary artery disease.  The culprit is thrombotic occlusion of the proximal LAD which was treated successfully with PCI and drug-eluting stent placement.  There is 70% stenosis in the distal left circumflex at the bifurcation of OM 3 which likely can be treated medically.  The RCA is subtotally occluded but it is small and nondominant. 3.  Moderately to severely reduced LV systolic function with severely elevated left ventricular end-diastolic pressure. 4.  Delay in door to device time due to the need to stabilize the patient and intubate.   Recommendations: Continue dual antiplatelet therapy for at least 12 months. Continue cangrelor infusion for another 3 hours. The patient was given 1 dose of IV furosemide with excellent urine output. Vent management per critical care.    Patient Profile     47 y.o. male with history of tobacco and cocaine abuse admitted with out-of-hospital cardiac arrest.  He was defibrillated in the field for ventricular fibrillation.  Taken to the Cath Lab and found to have multivessel disease with a culprit lesion in the proximal LAD.  Underwent successful PCI.  Assessment & Plan    # Anterior STEMI: High-sensitivity troponin was over 24,000.  He was found to have multivessel disease at cath.  He had 100% proximal LAD  lesion.  This was successfully opened with an Onyx drug-eluting stent.  He has residual disease in both the RCA and left circumflex.  Plan to medically manage otherwise.  Continue aspirin, ticagrelor, atorvastatin, and carvedilol.  LP(a) pending.  LDL was 137.  He will need repeat lipids and a CMP in 2 to 3 months.  Right now he is just been extubated and confused.  Will need to reiterate the importance of abstaining from cocaine and tobacco.  # VF Arrest:  Underwent successful defibrillation in the field.  Underwent PCI as above.  Started on carvedilol.  Extubated this morning.  # Hyperlipidemia: Continue atorvastatin.  # Acute systolic and diastolic heart failure: LVEF 35-40% on echo.  Blood pressure too low to titrate GDMT.  Started on carvedilol.  Will check hbg A1c and needs Jardiance or Farxiga prior to discharge.  # Polysubstance abuse: Will need aggressive intervention to help him with abstaining from tobacco and  cocaine.  # Transaminitis:  AST and ALT elevated.  Likely shock liver in the setting of VF arrest.  Okay to continue atorvastatin and will need to monitor closely.  # Fever:  Will check procalcitonin.  Chest x-ray on admission had bilateral opacities consistent with edema versus infiltrate.  Given that he came in with cardiac arrest and CPR, it is very likely he has some aspiration pneumonia.  UA was negative for infection.    Total critical care time: 45 minutes. Critical care time was exclusive of separately billable procedures and treating other patients. Critical care was necessary to treat or prevent imminent or life-threatening deterioration. Critical care was time spent personally by me on the following activities: development of treatment plan with patient and/or surrogate as well as nursing, discussions with consultants, evaluation of patient's response to treatment, examination of patient, obtaining history from patient or surrogate, ordering and performing treatments  and interventions, ordering and review of laboratory studies, ordering and review of radiographic studies, pulse oximetry and re-evaluation of patient's condition.   For questions or updates, please contact [facility] Please consult [email or web address] for contact info under        Signed, Chilton Si, MD  07/23/2022, 11:32 AM

## 2022-07-23 NOTE — Progress Notes (Signed)
Patient extubated to 4L nasal canula with no complications.

## 2022-07-23 NOTE — Procedures (Signed)
Extubation Procedure Note  Patient Details:   Name: SAVOY BIRCHARD DOB: 04-14-1975 MRN: [id number]   Airway Documentation:    Vent end date: 07/23/22 Vent end time: 0925   Evaluation  O2 sats: stable throughout Complications: No apparent complications Patient did tolerate procedure well. Bilateral Breath Sounds: Rhonchi, Diminished   Yes able to cough and speak  Fleet Contras 07/23/2022, 9:31 AM

## 2022-07-23 NOTE — Progress Notes (Signed)
PHARMACY CONSULT NOTE  Pharmacy Consult for Electrolyte Monitoring and Replacement   Recent Labs: Potassium (mmol/L)  Date Value  07/23/2022 3.5  08/28/2013 3.7   Magnesium (mg/dL)  Date Value  16/11/9602 2.0   Calcium (mg/dL)  Date Value  54/10/8117 8.7 (L)   Calcium, Total (mg/dL)  Date Value  14/78/2956 8.9   Albumin (g/dL)  Date Value  21/30/8657 3.9  08/28/2013 3.9   Phosphorus (mg/dL)  Date Value  84/69/6295 4.9 (H)   Sodium (mmol/L)  Date Value  07/23/2022 139  08/28/2013 137     Assessment: 47 y.o. male who presents after reported witnessed arrest in front of family. Pharmacy is asked to follow and replace electrolytes while in CCU  Goal of Therapy:  Potassium 4.0 - 5.1 mmol/L Magnesium 2.0 - 2.4 mg/dL All Other Electrolytes WNL  Plan:  ---40 mEq KCl per tube x 1 ---recheck electrolytes in am  Lowella Bandy ,PharmD Clinical Pharmacist 07/23/2022 7:04 AM

## 2022-07-23 NOTE — Progress Notes (Signed)
SLP Cancellation Note  Patient Details Name: Terrance Thompson MRN: [id number] DOB: Jan 19, 1976   Cancelled treatment:       Reason Eval/Treat Not Completed: Medical issues which prohibited therapy   SLP consult received and appreciated. Chart review completed. Per chart review and discussion with RN, pt recently extubated (<60 minutes ago), lethargic, and with AMS. Will plan for SLP evaluation next date, as appropriate. RN aware and in agreement.  Clyde Canterbury, M.S., CCC-SLP Speech-Language Pathologist [facility] [phone] Arnette Felts)  Woodroe Chen 07/23/2022, 10:37 AM

## 2022-07-23 NOTE — Progress Notes (Signed)
CRITICAL CARE PROGRESS NOTE    Name: Terrance Thompson MRN: [id number] DOB: 10-Feb-1976     LOS: 1   SUBJECTIVE FINDINGS & SIGNIFICANT EVENTS    History of Presenting Illness:   This 47 year old male with essentially no previous medical history, tobacco smoking and cocaine abuse who came in after having acute anterior myocardial infarction. He apparently had exertional chest pain for some time. He sustained LOC while home with family and they started CPR on him.  EMS did perform defibrillation for VF with ACLS and subsequent ROSC.  Patient was severely agitated required emergent cardiac evaluation.  Status post consultation with cardiology and immediate cardiac cath was ordered however patient progressively became more agitated with requirement for endotracheal intubation.  Intraoperatively he had successful PCI and is arriving to medical intensive care unit on mechanical ventilation for medical management and weaning.   07/23/22 - Patient s/p liberation from mechanical ventilator and is being optimized medically for TRH transfer. CBC & BMP stable this am. PT /OT initiated. Cardiology on board appreciate input.   Lines/tubes : Airway 8 mm (Active)     NG/OG Vented/Dual Lumen 16 Fr. Oral (Active)  Tube Position (Required) Marking at nare/corner of mouth 07/22/22 1514  Measurement (cm) (Required) 60 cm 07/22/22 1514     Urethral Catheter vet, rn Temperature probe 16 Fr. (Active)    Microbiology/Sepsis markers: Results for orders placed or performed during the hospital encounter of 07/22/22  MRSA Next Gen by PCR, Nasal     Status: None   Collection Time: 07/22/22  4:46 PM   Specimen: Nasal Mucosa; Nasal Swab  Result Value Ref Range Status   MRSA by PCR Next Gen NOT DETECTED NOT DETECTED Final    Comment: (NOTE) The  GeneXpert MRSA Assay (FDA approved for NASAL specimens only), is one component of a comprehensive MRSA colonization surveillance program. It is not intended to diagnose MRSA infection nor to guide or monitor treatment for MRSA infections. Test performance is not FDA approved in patients less than 58 years old. Performed at [facility], [address], [place], [place] [address]     Anti-infectives:  Anti-infectives (From admission, onward)    None      PAST MEDICAL HISTORY   No past medical history on file.   SURGICAL HISTORY   No past surgical history on file.   FAMILY HISTORY   No family history on file.   SOCIAL HISTORY   Social History   Tobacco Use   Smoking status: Former   Smokeless tobacco: Former     MEDICATIONS   Current Medication:  Current Facility-Administered Medications:    0.9 %  sodium chloride infusion, , Intravenous, Continuous, Arida, Muhammad A, MD, Last Rate: 10 mL/hr at 07/23/22 0800, Infusion Verify at 07/23/22 0800   0.9 %  sodium chloride infusion, 250 mL, Intravenous, PRN, Kirke Corin, Muhammad A, MD   0.9 %  sodium chloride infusion, 250 mL, Intravenous, Continuous, Rust-Chester, Micheline Rough L, NP   acetaminophen (TYLENOL) tablet 650 mg, 650 mg, Per Tube, Q4H PRN, Iran Ouch, MD   [START ON 07/24/2022] aspirin chewable tablet 81 mg, 81 mg, Per Tube, Daily, Lowella Bandy, RPH   atorvastatin (LIPITOR) tablet 80 mg, 80 mg, Per Tube, Daily, Arida, Muhammad A, MD, 80 mg at 07/23/22 0912   carvedilol (COREG) tablet 3.125 mg, 3.125 mg, Per Tube, BID WC, Arida, Muhammad A, MD, 3.125 mg at 07/23/22 0756   Chlorhexidine Gluconate Cloth 2 % PADS 6 each,  6 each, Topical, Q0600, Vida Rigger, MD   dexmedetomidine (PRECEDEX) 400 MCG/100ML (4 mcg/mL) infusion, 0-1.2 mcg/kg/hr, Intravenous, Titrated, Marithza Malachi, MD, Last Rate: 14.58 mL/hr at 07/23/22 0925, 0.6 mcg/kg/hr at 07/23/22 0925   docusate (COLACE) 50 MG/5ML liquid 100 mg,  100 mg, Per Tube, BID, Rust-Chester, Britton L, NP, 100 mg at 07/23/22 0912   enoxaparin (LOVENOX) injection 40 mg, 40 mg, Subcutaneous, Q24H, Arida, Muhammad A, MD, 40 mg at 07/23/22 0756   famotidine (PEPCID) tablet 20 mg, 20 mg, Per Tube, BID, Rust-Chester, Britton L, NP, 20 mg at 07/23/22 0912   fentaNYL in NS (58mcg/ml) infusion-PREMIX, 0-400 mcg/hr, Intravenous, Continuous, Shontay Wallner, MD, Stopped at 07/23/22 0902   midazolam (VERSED) injection 1-2 mg, 1-2 mg, Intravenous, Q1H PRN, Rust-Chester, Britton L, NP, 2 mg at 07/23/22 0751   norepinephrine (LEVOPHED) 4mg  in (0.016 mg/mL) premix infusion, 2-10 mcg/min, Intravenous, Titrated, Rust-Chester, Britton L, NP   ondansetron (ZOFRAN) injection 4 mg, 4 mg, Intravenous, Q6H PRN, Iran Ouch, MD   Oral care mouth rinse, 15 mL, Mouth Rinse, PRN, Vida Rigger, MD   Oral care mouth rinse, 15 mL, Mouth Rinse, QID, Jveon Pound, MD   pantoprazole (PROTONIX) injection 40 mg, 40 mg, Intravenous, Q24H, Talaysha Freeberg, MD, 40 mg at 07/22/22 1730   polyethylene glycol (MIRALAX / GLYCOLAX) packet 17 g, 17 g, Per Tube, Daily, Rust-Chester, Britton L, NP, 17 g at 07/22/22 2146   sodium chloride flush (NS) 0.9 % injection 3 mL, 3 mL, Intravenous, Q12H, Arida, Muhammad A, MD, 3 mL at 07/23/22 0910   sodium chloride flush (NS) 0.9 % injection 3 mL, 3 mL, Intravenous, PRN, Iran Ouch, MD   ticagrelor (BRILINTA) tablet 90 mg, 90 mg, Per Tube, BID, Foye Deer, RPH, 90 mg at 07/23/22 1610    ALLERGIES   Patient has no known allergies.    REVIEW OF SYSTEMS     10 point ROS unable to conduct due to sedation and mechanical ventilation   PHYSICAL EXAMINATION   Vital Signs: Temp:  [97.6 F (36.4 C)-100.9 F (38.3 C)] 100.8 F (38.2 C) (06/02 0900) Pulse Rate:  [80-110] 88 (06/02 0925) Resp:  [17-28] 26 (06/02 0925) BP: (81-193)/(63-127) 103/73 (06/02 0900) SpO2:  [90 %-100 %] 97 % (06/02 0925) FiO2  (%):  [30 %-60 %] 30 % (06/02 0921) Weight:  [97.2 kg-103.4 kg] 97.2 kg (06/01 1642)  GENERAL:Age appropriate on MV HEAD: Normocephalic, atraumatic.  EYES: Pupils equal, round, reactive to light.  No scleral icterus.  MOUTH: Moist mucosal membrane. NECK: Supple. No thyromegaly. No nodules. No JVD.  PULMONARY: mechanical ventilator sounds CARDIOVASCULAR: S1 and S2. Regular rate and rhythm. No murmurs, rubs, or gallops.  GASTROINTESTINAL: Soft, nontender, non-distended. No masses. Positive bowel sounds. No hepatosplenomegaly.  MUSCULOSKELETAL: No swelling, clubbing, or edema.  NEUROLOGIC: Mild distress due to acute illness SKIN:intact,warm,dry   PERTINENT DATA     Infusions:  sodium chloride 10 mL/hr at 07/23/22 0800   sodium chloride     sodium chloride     dexmedetomidine (PRECEDEX) IV infusion 0.6 mcg/kg/hr (07/23/22 0925)   fentaNYL infusion INTRAVENOUS Stopped (07/23/22 0902)   norepinephrine (LEVOPHED) Adult infusion     Scheduled Medications:  [START ON 07/24/2022] aspirin  81 mg Per Tube Daily   atorvastatin  80 mg Per Tube Daily   carvedilol  3.125 mg Per Tube BID WC   Chlorhexidine Gluconate Cloth  6 each Topical Q0600   docusate  100 mg Per Tube  BID   enoxaparin (LOVENOX) injection  40 mg Subcutaneous Q24H   famotidine  20 mg Per Tube BID   mouth rinse  15 mL Mouth Rinse QID   pantoprazole (PROTONIX) IV  40 mg Intravenous Q24H   polyethylene glycol  17 g Per Tube Daily   sodium chloride flush  3 mL Intravenous Q12H   ticagrelor  90 mg Per Tube BID   PRN Medications: sodium chloride, acetaminophen, midazolam, ondansetron (ZOFRAN) IV, mouth rinse, sodium chloride flush Hemodynamic parameters:   Intake/Output: 06/01 0701 - 06/02 0700 In: 1466.1 [I.V.:1466.1] Out: 1895 [Urine:1895]  Ventilator  Settings: Vent Mode: PSV;CPAP FiO2 (%):  [30 %-60 %] 30 % Set Rate:  [20 bmp] 20 bmp Vt Set:  [500 mL] 500 mL PEEP:  [5 cmH20] 5 cmH20 Pressure Support:  [5 cmH20] 5  cmH20 Plateau Pressure:  [12 cmH20-15 cmH20] 14 cmH20   LAB RESULTS:  Basic Metabolic Panel: Recent Labs  Lab 07/22/22 1447 07/22/22 1814 07/23/22 0320  NA 139 138 139  K 3.6 3.6 3.5  CL 108 106 107  CO2 22 22 21*  GLUCOSE 149* 104* 98  BUN 14 16 19   CREATININE 1.06 1.01 1.16  CALCIUM 9.1 8.8* 8.7*  MG  --  2.3 2.0  PHOS  --  5.1* 4.9*   Liver Function Tests: Recent Labs  Lab 07/22/22 1447 07/22/22 1814 07/23/22 0320  AST 51* 249* 240*  ALT 36 68* 75*  ALKPHOS 84 83 75  BILITOT 0.7 1.0 0.7  PROT 7.5 7.4 7.0  ALBUMIN 4.3 4.3 3.9   Recent Labs  Lab 07/22/22 1814  LIPASE 37   No results for input(s): "AMMONIA" in the last 168 hours. CBC: Recent Labs  Lab 07/22/22 1447 07/23/22 0320  WBC 10.9* 9.2  NEUTROABS 5.1  --   HGB 16.3 15.3  HCT 48.3 45.0  MCV 88.0 86.4  PLT 287 252    Cardiac Enzymes: No results for input(s): "CKTOTAL", "CKMB", "CKMBINDEX", "TROPONINI" in the last 168 hours. BNP: Invalid input(s): "POCBNP" CBG: Recent Labs  Lab 07/22/22 1642 07/22/22 1948 07/22/22 2319 07/23/22 0331 07/23/22 0748  GLUCAP 85 93 100* 99 96       IMAGING RESULTS:     ASSESSMENT AND PLAN      Acute Anterior Myocardial infarction  -s/p EKG and TTE -cardiology on case - Dr Kirke Corin appreciate input - s/p left heart cath with stenting of PCI --supportive care and ICU telemetry monitoring.    Polysubstance abuse  - drug rehab on outpatient   - monitor for withdrawal syndrome  GI/Nutrition GI PROPHYLAXIS as indicated DIET-->TF's as tolerated Constipation protocol as indicated  ENDO - ICU hypoglycemic\Hyperglycemia protocol -check FSBS per protocol   ELECTROLYTES -follow labs as needed -replace as needed -pharmacy consultation   DVT/GI PRX ordered -SCDs  TRANSFUSIONS AS NEEDED MONITOR FSBS ASSESS the need for LABS as needed    Critical care provider statement:   Total critical care time: 33 minutes   Performed by:  Karna Christmas MD   Critical care time was exclusive of separately billable procedures and treating other patients.   Critical care was necessary to treat or prevent imminent or life-threatening deterioration.   Critical care was time spent personally by me on the following activities: development of treatment plan with patient and/or surrogate as well as nursing, discussions with consultants, evaluation of patient's response to treatment, examination of patient, obtaining history from patient or surrogate, ordering and performing treatments and interventions, ordering and review of  laboratory studies, ordering and review of radiographic studies, pulse oximetry and re-evaluation of patient's condition.    Vida Rigger, M.D.  Pulmonary & Critical Care Medicine

## 2022-07-23 NOTE — Progress Notes (Signed)
Pharmacy Antibiotic Note  Terrance Thompson is a 47 y.o. male admitted on 07/22/2022 with  aspiration pneumonia .  Pharmacy has been consulted for Unasyn dosing.  Plan: Unasyn 3g IV q6h  Height: 6' (182.9 cm) Weight: 97.2 kg (214 lb 4.6 oz) IBW/kg (Calculated) : 77.6  Temp (24hrs), Avg:100.6 F (38.1 C), Min:99.3 F (37.4 C), Max:101.5 F (38.6 C)  Recent Labs  Lab 07/22/22 1447 07/22/22 1814 07/23/22 0320  WBC 10.9*  --  9.2  CREATININE 1.06 1.01 1.16    Estimated Creatinine Clearance: 93 mL/min (by C-G formula based on SCr of 1.16 mg/dL).    No Known Allergies  Antimicrobials this admission: Unasyn 6/2 >>     Dose adjustments this admission:  Microbiology results:   Thank you for allowing pharmacy to be a part of this patient's care.  Clovia Cuff, PharmD, BCPS 07/23/2022 7:14 PM

## 2022-07-23 NOTE — Progress Notes (Signed)
Upon assessment while updating family, this RN was informed that the patient takes Suboxone (Buprenorphine/Naloxone) daily. According to the patient's two sons, Terrance Thompson and Terrance Thompson, the patient takes 1/4 of an 8mg (Buprenorphine)/2mg (Naloxone) tab/strip each morning to avoid opiate withdrawals. The patient is not prescribed this medication, and family is unaware of how it is obtained. This information was verified with the patient's live-in girlfriend, and she confirmed that it is true. [name] NP was made aware, and the situation will be discussed with pharmacy in order to coordinate appropriate care. PRN Versed was added to help obtain ordered RASS/CPOT goal. See new orders & eMAR.

## 2022-07-24 ENCOUNTER — Encounter: Payer: Self-pay | Admitting: Cardiovascular Disease

## 2022-07-24 DIAGNOSIS — R7401 Elevation of levels of liver transaminase levels: Secondary | ICD-10-CM

## 2022-07-24 DIAGNOSIS — I5041 Acute combined systolic (congestive) and diastolic (congestive) heart failure: Secondary | ICD-10-CM

## 2022-07-24 DIAGNOSIS — F1910 Other psychoactive substance abuse, uncomplicated: Secondary | ICD-10-CM

## 2022-07-24 DIAGNOSIS — I469 Cardiac arrest, cause unspecified: Secondary | ICD-10-CM

## 2022-07-24 LAB — GLUCOSE, CAPILLARY
Glucose-Capillary: 106 mg/dL — ABNORMAL HIGH (ref 70–99)
Glucose-Capillary: 109 mg/dL — ABNORMAL HIGH (ref 70–99)
Glucose-Capillary: 112 mg/dL — ABNORMAL HIGH (ref 70–99)
Glucose-Capillary: 122 mg/dL — ABNORMAL HIGH (ref 70–99)
Glucose-Capillary: 91 mg/dL (ref 70–99)

## 2022-07-24 LAB — HEPATIC FUNCTION PANEL
ALT: 47 U/L — ABNORMAL HIGH (ref 0–44)
AST: 75 U/L — ABNORMAL HIGH (ref 15–41)
Albumin: 3.6 g/dL (ref 3.5–5.0)
Alkaline Phosphatase: 73 U/L (ref 38–126)
Bilirubin, Direct: 0.2 mg/dL (ref 0.0–0.2)
Indirect Bilirubin: 1.1 mg/dL — ABNORMAL HIGH (ref 0.3–0.9)
Total Bilirubin: 1.3 mg/dL — ABNORMAL HIGH (ref 0.3–1.2)
Total Protein: 6.8 g/dL (ref 6.5–8.1)

## 2022-07-24 LAB — POCT I-STAT 7, (LYTES, BLD GAS, ICA,H+H)
Acid-base deficit: 4 mmol/L — ABNORMAL HIGH (ref 0.0–2.0)
Bicarbonate: 22.2 mmol/L (ref 20.0–28.0)
Calcium, Ion: 1.24 mmol/L (ref 1.15–1.40)
HCT: 46 % (ref 39.0–52.0)
Hemoglobin: 15.6 g/dL (ref 13.0–17.0)
O2 Saturation: 96 %
Potassium: 3.7 mmol/L (ref 3.5–5.1)
Sodium: 140 mmol/L (ref 135–145)
TCO2: 24 mmol/L (ref 22–32)
pCO2 arterial: 44 mmHg (ref 32–48)
pH, Arterial: 7.312 — ABNORMAL LOW (ref 7.35–7.45)
pO2, Arterial: 94 mmHg (ref 83–108)

## 2022-07-24 LAB — BASIC METABOLIC PANEL
Anion gap: 9 (ref 5–15)
BUN: 22 mg/dL — ABNORMAL HIGH (ref 6–20)
CO2: 23 mmol/L (ref 22–32)
Calcium: 8.4 mg/dL — ABNORMAL LOW (ref 8.9–10.3)
Chloride: 108 mmol/L (ref 98–111)
Creatinine, Ser: 1.2 mg/dL (ref 0.61–1.24)
GFR, Estimated: >60 mL/min (ref >60)
Glucose, Bld: 102 mg/dL — ABNORMAL HIGH (ref 70–99)
Potassium: 3.6 mmol/L (ref 3.5–5.1)
Sodium: 140 mmol/L (ref 135–145)

## 2022-07-24 LAB — CBC
HCT: 39.9 % (ref 39.0–52.0)
Hemoglobin: 13.6 g/dL (ref 13.0–17.0)
MCH: 30 pg (ref 26.0–34.0)
MCHC: 34.1 g/dL (ref 30.0–36.0)
MCV: 87.9 fL (ref 80.0–100.0)
Platelets: 179 10*3/uL (ref 150–400)
RBC: 4.54 MIL/uL (ref 4.22–5.81)
RDW: 14 % (ref 11.5–15.5)
WBC: 8.8 10*3/uL (ref 4.0–10.5)
nRBC: 0 % (ref 0.0–0.2)

## 2022-07-24 LAB — POCT ACTIVATED CLOTTING TIME
Activated Clotting Time: 220 seconds
Activated Clotting Time: 244 seconds

## 2022-07-24 LAB — PHOSPHORUS: Phosphorus: 3.6 mg/dL (ref 2.5–4.6)

## 2022-07-24 LAB — MAGNESIUM: Magnesium: 2.3 mg/dL (ref 1.7–2.4)

## 2022-07-24 LAB — PROCALCITONIN: Procalcitonin: <0.1 ng/mL

## 2022-07-24 MED ORDER — LIDOCAINE 5 % EX PTCH
1.0000 | MEDICATED_PATCH | CUTANEOUS | Status: DC
  Administered 2022-07-24: 1 via TRANSDERMAL
  Filled 2022-07-24 (×2): qty 1

## 2022-07-24 MED ORDER — CARVEDILOL 6.25 MG PO TABS
6.2500 mg | ORAL_TABLET | Freq: Two times a day (BID) | ORAL | Status: DC
  Administered 2022-07-24 – 2022-07-25 (×2): 6.25 mg via ORAL
  Filled 2022-07-24 (×2): qty 1

## 2022-07-24 MED ORDER — POTASSIUM CHLORIDE 20 MEQ PO PACK
40.0000 meq | PACK | Freq: Two times a day (BID) | ORAL | Status: AC
  Administered 2022-07-24 (×2): 40 meq via ORAL
  Filled 2022-07-24 (×2): qty 2

## 2022-07-24 MED ORDER — KETOROLAC TROMETHAMINE 30 MG/ML IJ SOLN
30.0000 mg | Freq: Once | INTRAMUSCULAR | Status: AC
  Administered 2022-07-24: 30 mg via INTRAVENOUS
  Filled 2022-07-24: qty 1

## 2022-07-24 MED ORDER — NICOTINE 21 MG/24HR TD PT24
21.0000 mg | MEDICATED_PATCH | Freq: Every day | TRANSDERMAL | Status: DC
  Administered 2022-07-24 – 2022-07-25 (×2): 21 mg via TRANSDERMAL
  Filled 2022-07-24 (×2): qty 1

## 2022-07-24 MED ORDER — LOSARTAN POTASSIUM 25 MG PO TABS
25.0000 mg | ORAL_TABLET | Freq: Every day | ORAL | Status: DC
  Administered 2022-07-24 – 2022-07-25 (×2): 25 mg via ORAL
  Filled 2022-07-24 (×2): qty 1

## 2022-07-24 MED ORDER — KETOROLAC TROMETHAMINE 15 MG/ML IJ SOLN
15.0000 mg | Freq: Four times a day (QID) | INTRAMUSCULAR | Status: DC
  Administered 2022-07-24 – 2022-07-25 (×5): 15 mg via INTRAVENOUS
  Filled 2022-07-24 (×5): qty 1

## 2022-07-24 MED ORDER — IBUPROFEN 400 MG PO TABS
600.0000 mg | ORAL_TABLET | Freq: Four times a day (QID) | ORAL | Status: DC | PRN
  Administered 2022-07-24: 600 mg via ORAL
  Filled 2022-07-24: qty 2

## 2022-07-24 MED ORDER — NICOTINE POLACRILEX 2 MG MT GUM: 2.0000 mg | CHEWING_GUM | OROMUCOSAL | Status: DC | PRN

## 2022-07-24 MED ORDER — MORPHINE SULFATE (PF) 2 MG/ML IV SOLN
2.0000 mg | INTRAVENOUS | Status: DC | PRN
  Administered 2022-07-24 – 2022-07-25 (×2): 2 mg via INTRAVENOUS
  Filled 2022-07-24 (×2): qty 1

## 2022-07-24 NOTE — Progress Notes (Signed)
Rounding Note    Patient Name: Terrance Thompson Date of Encounter: 07/24/2022  [facility] Cardiologist: Lorine Bears, MD   Subjective   He complains of musculoskeletal chest pain from CPR.  No chest tightness or shortness of breath.   Inpatient Medications    Scheduled Meds:  aspirin  81 mg Oral Daily   atorvastatin  80 mg Oral Daily   carvedilol  6.25 mg Oral BID WC   Chlorhexidine Gluconate Cloth  6 each Topical Q0600   enoxaparin (LOVENOX) injection  40 mg Subcutaneous Q24H   famotidine  20 mg Oral BID   ketorolac  15 mg Intravenous Q6H   lidocaine  1 patch Transdermal Q24H   losartan  25 mg Oral Daily   nicotine  21 mg Transdermal Daily   pantoprazole (PROTONIX) IV  40 mg Intravenous Q24H   potassium chloride  40 mEq Oral BID   sodium chloride flush  3 mL Intravenous Q12H   ticagrelor  90 mg Oral BID   Continuous Infusions:  sodium chloride Stopped (07/23/22 1156)   sodium chloride     sodium chloride     ampicillin-sulbactam (UNASYN) IV Stopped (07/24/22 0851)   PRN Meds: sodium chloride, acetaminophen, ibuprofen, morphine injection, nicotine polacrilex, ondansetron (ZOFRAN) IV, mouth rinse, sodium chloride flush   Vital Signs    Vitals:   07/24/22 1040 07/24/22 1100 07/24/22 1200 07/24/22 1300  BP:  137/84 138/80 (!) 140/80  Pulse: 79 82 83 90  Resp: 17 (!) 21 18 (!) 29  Temp:      TempSrc:      SpO2: 96% 94% 97% 96%  Weight:      Height:        Intake/Output Summary (Last 24 hours) at 07/24/2022 1322 Last data filed at 07/24/2022 1316 Gross per 24 hour  Intake 1214.98 ml  Output 675 ml  Net 539.98 ml      07/22/2022    4:42 PM 07/22/2022    2:45 PM 02/17/2018    9:23 AM  Last 3 Weights  Weight (lbs) 214 lb 4.6 oz 227 lb 15.3 oz 228 lb  Weight (kg) 97.2 kg 103.4 kg 103.42 kg      Telemetry    Sinus rhythm.  PVCs.- Personally Reviewed  ECG    07/22/2022: Sinus rhythm.  Rate 94 beats minute.  Anterior ST elevation. - Personally  Reviewed  Physical Exam   VS:  BP (!) 140/80   Pulse 90   Temp 97.9 F (36.6 C) (Oral)   Resp (!) 29   Ht 6' (1.829 m)   Wt 97.2 kg   SpO2 96%   BMI 29.06 kg/m  , BMI Body mass index is 29.06 kg/m. GENERAL: Well-developed and alert in no acute distress. HEENT: Pupils equal round and reactive, fundi not visualized, oral mucosa unremarkable NECK:  No jugular venous distention, waveform within normal limits, carotid upstroke brisk and symmetric, no bruits, no thyromegal LUNGS:  Clear to auscultation bilaterally HEART:  RRR.  PMI not displaced or sustained,S1 and S2 within normal limits, no S3, no S4, no clicks, no rubs, no murmurs ABD:  Flat, positive bowel sounds normal in frequency in pitch, no bruits, no rebound, no guarding, no midline pulsatile mass, no hepatomegaly, no splenomegaly EXT:  2 plus pulses throughout, no edema, no cyanosis no clubbing SKIN:  No rashes no nodules NEURO:  Cranial nerves II through XII grossly intact, motor grossly intact throughout PSYCH:  Cognitively intact, oriented to person place  and time Right radial pulse is normal with no hematoma.   Labs    High Sensitivity Troponin:   Recent Labs  Lab 07/22/22 1447 07/22/22 1645  TROPONINIHS 276* >24,000*     Chemistry Recent Labs  Lab 07/22/22 1814 07/23/22 0320 07/24/22 0511  NA 138 139 140  K 3.6 3.5 3.6  CL 106 107 108  CO2 22 21* 23  GLUCOSE 104* 98 102*  BUN 16 19 22*  CREATININE 1.01 1.16 1.20  CALCIUM 8.8* 8.7* 8.4*  MG 2.3 2.0 2.3  PROT 7.4 7.0 6.8  ALBUMIN 4.3 3.9 3.6  AST 249* 240* 75*  ALT 68* 75* 47*  ALKPHOS 83 75 73  BILITOT 1.0 0.7 1.3*  GFRNONAA >60 >60 >60  ANIONGAP 10 11 9     Lipids  Recent Labs  Lab 07/22/22 1447  CHOL 204*  TRIG 171*  HDL 36*  LDLCALC 134*  CHOLHDL 5.7    Hematology Recent Labs  Lab 07/22/22 1447 07/22/22 1552 07/23/22 0320 07/24/22 0511  WBC 10.9*  --  9.2 8.8  RBC 5.49  --  5.21 4.54  HGB 16.3 15.6 15.3 13.6  HCT 48.3 46.0  45.0 39.9  MCV 88.0  --  86.4 87.9  MCH 29.7  --  29.4 30.0  MCHC 33.7  --  34.0 34.1  RDW 14.1  --  14.5 14.0  PLT 287  --  252 179   Thyroid No results for input(s): "TSH", "FREET4" in the last 168 hours.  BNPNo results for input(s): "BNP", "PROBNP" in the last 168 hours.  DDimer No results for input(s): "DDIMER" in the last 168 hours.   Radiology    ECHOCARDIOGRAM COMPLETE  Result Date: 07/23/2022    ECHOCARDIOGRAM REPORT   Patient Name:   Gayle CHRISTOBAL ALSBROOKS Date of Exam: 07/23/2022 Medical Rec #:  [id number]    Height:       72.0 in Accession #:    [id number]   Weight:       214.3 lb Date of Birth:  1975-05-20     BSA:          2.194 m Patient Age:    47 years     BP:           193/127 mmHg Patient Gender: M            HR:           86 bpm. Exam Location:  [facility] Procedure: 2D Echo, Cardiac Doppler and Color Doppler Indications:    Acute myocardial infarction I21.9  History:        Patient has no prior history of Echocardiogram examinations.                 Acute MI; Cocaine abuse.  Sonographer:    Dondra Prader RVT RCS Referring Phys: [id number] Jury Caserta A Henretta Quist  Sonographer Comments: Technically challenging due to patient on vent and unable to reposition IMPRESSIONS  1. There is moderate septal hypertrophy with otherwise mild concentric LVH. Left ventricular ejection fraction, by estimation, is 35 to 40%. The left ventricle has moderately decreased function. The left ventricle demonstrates regional wall motion abnormalities (see scoring diagram/findings for description). There is moderate left ventricular hypertrophy of the septal segment. Left ventricular diastolic parameters are consistent with Grade I diastolic dysfunction (impaired relaxation).  2. Right ventricular systolic function is normal. The right ventricular size is normal. There is normal pulmonary artery systolic pressure.  3. Left atrial size was mildly dilated.  4. The mitral valve is normal in structure. Trivial mitral valve regurgitation. No  evidence of mitral stenosis.  5. The aortic valve is tricuspid. Aortic valve regurgitation is not visualized. No aortic stenosis is present.  6. The inferior vena cava is normal in size with greater than 50% respiratory variability, suggesting right atrial pressure of 3 mmHg. FINDINGS  Left Ventricle: There is moderate septal hypertrophy with otherwise mild concentric LVH. Left ventricular ejection fraction, by estimation, is 35 to 40%. The left ventricle has moderately decreased function. The left ventricle demonstrates regional wall  motion abnormalities. The left ventricular internal cavity size was normal in size. There is moderate left ventricular hypertrophy of the septal segment. Left ventricular diastolic parameters are consistent with Grade I diastolic dysfunction (impaired relaxation). Normal left ventricular filling pressure.  LV Wall Scoring: The entire anterior wall, anterior septum, and apex are hypokinetic. The entire lateral wall, inferior septum, and entire inferior wall are normal. Right Ventricle: The right ventricular size is normal. No increase in right ventricular wall thickness. Right ventricular systolic function is normal. There is normal pulmonary artery systolic pressure. The tricuspid regurgitant velocity is 1.33 m/s, and  with an assumed right atrial pressure of 3 mmHg, the estimated right ventricular systolic pressure is 10.1 mmHg. Left Atrium: Left atrial size was mildly dilated. Right Atrium: Right atrial size was normal in size. Pericardium: There is no evidence of pericardial effusion. Mitral Valve: The mitral valve is normal in structure. Trivial mitral valve regurgitation. No evidence of mitral valve stenosis. Tricuspid Valve: The tricuspid valve is normal in structure. Tricuspid valve regurgitation is trivial. No evidence of tricuspid stenosis. Aortic Valve: The aortic valve is tricuspid. Aortic valve regurgitation is not visualized. No aortic stenosis is present. Aortic valve  mean gradient measures 4.0 mmHg. Aortic valve peak gradient measures 7.6 mmHg. Aortic valve area, by VTI measures 3.71 cm. Pulmonic Valve: The pulmonic valve was normal in structure. Pulmonic valve regurgitation is not visualized. No evidence of pulmonic stenosis. Aorta: The aortic root is normal in size and structure. Venous: The inferior vena cava is normal in size with greater than 50% respiratory variability, suggesting right atrial pressure of 3 mmHg. IAS/Shunts: No atrial level shunt detected by color flow Doppler.  LEFT VENTRICLE PLAX 2D LVIDd:         4.65 cm   Diastology LVIDs:         3.80 cm   LV e' medial:    5.66 cm/s LV PW:         1.00 cm   LV E/e' medial:  7.3 LV IVS:        1.25 cm   LV e' lateral:   4.46 cm/s LVOT diam:     2.10 cm   LV E/e' lateral: 9.3 LV SV:         67 LV SV Index:   30 LVOT Area:     3.46 cm  RIGHT VENTRICLE RV Basal diam:  2.40 cm RV S prime:     14.40 cm/s TAPSE (M-mode): 1.7 cm LEFT ATRIUM             Index        RIGHT ATRIUM           Index LA diam:        3.60 cm 1.64 cm/m   RA Area:     10.50 cm LA Vol (A2C):   57.0 ml 25.98 ml/m  RA Volume:   18.60 ml  8.48  ml/m LA Vol (A4C):   72.8 ml 33.18 ml/m LA Biplane Vol: 64.6 ml 29.44 ml/m  AORTIC VALVE                    PULMONIC VALVE AV Area (Vmax):    3.16 cm     PV Vmax:       0.89 m/s AV Area (Vmean):   3.11 cm     PV Peak grad:  3.2 mmHg AV Area (VTI):     3.71 cm AV Vmax:           138.00 cm/s AV Vmean:          90.500 cm/s AV VTI:            0.180 m AV Peak Grad:      7.6 mmHg AV Mean Grad:      4.0 mmHg LVOT Vmax:         126.00 cm/s LVOT Vmean:        81.200 cm/s LVOT VTI:          0.193 m LVOT/AV VTI ratio: 1.07  AORTA Ao Root diam: 3.60 cm MITRAL VALVE               TRICUSPID VALVE MV Area (PHT): 4.52 cm    TR Peak grad:   7.1 mmHg MV Decel Time: 168 msec    TR Vmax:        133.00 cm/s MV E velocity: 41.40 cm/s MV A velocity: 83.30 cm/s  SHUNTS MV E/A ratio:  0.50        Systemic VTI:  0.19 m                             Systemic Diam: 2.10 cm Chilton Si MD Electronically signed by Chilton Si MD Signature Date/Time: 07/23/2022/9:28:19 AM    Final    DG Abd 1 View  Result Date: 07/22/2022 CLINICAL DATA:  Evaluate NG tube placement EXAM: ABDOMEN - 1 VIEW COMPARISON:  None Available. FINDINGS: The NG tube terminates in the stomach. IMPRESSION: The NG tube terminates in the stomach. Electronically Signed   By: Gerome Sam III M.D.   On: 07/22/2022 17:52   CARDIAC CATHETERIZATION  Addendum Date: 07/22/2022     Prox RCA lesion is 90% stenosed.   Prox RCA to Mid RCA lesion is 99% stenosed.   RV Branch lesion is 99% stenosed.   1st Mrg lesion is 60% stenosed.   Mid Cx lesion is 40% stenosed.   Prox LAD lesion is 100% stenosed.   Dist Cx lesion is 70% stenosed.   3rd Mrg lesion is 40% stenosed.   A drug-eluting stent was successfully placed using a STENT ONYX FRONTIER 3.5X18.   Post intervention, there is a 0% residual stenosis.   There is moderate to severe left ventricular systolic dysfunction.   LV end diastolic pressure is severely elevated.   The left ventricular ejection fraction is 25-35% by visual estimate. 1.  Out of hospital cardiac arrest due to anterior STEMI. 2.  Left dominant coronary arteries with significant three-vessel coronary artery disease.  The culprit is thrombotic occlusion of the proximal LAD which was treated successfully with PCI and drug-eluting stent placement.  There is 70% stenosis in the distal left circumflex at the bifurcation of OM 3 which likely can be treated medically.  The RCA is subtotally occluded but it is small and nondominant. 3.  Moderately to  severely reduced LV systolic function with severely elevated left ventricular end-diastolic pressure. 4.  Delay in door to device time due to the need to stabilize the patient and intubate. Recommendations: Continue dual antiplatelet therapy for at least 12 months. Continue cangrelor infusion for another 3 hours. The  patient was given 1 dose of IV furosemide with excellent urine output. Vent management per critical care.  Result Date: 07/22/2022   Prox RCA lesion is 90% stenosed.   Prox RCA to Mid RCA lesion is 99% stenosed.   RV Branch lesion is 99% stenosed.   1st Mrg lesion is 60% stenosed.   Mid Cx lesion is 40% stenosed.   Prox LAD lesion is 100% stenosed.   Dist Cx lesion is 70% stenosed.   3rd Mrg lesion is 40% stenosed.   A drug-eluting stent was successfully placed using a STENT ONYX FRONTIER 3.5X18.   Post intervention, there is a 0% residual stenosis.   There is moderate to severe left ventricular systolic dysfunction.   LV end diastolic pressure is severely elevated.   The left ventricular ejection fraction is 25-35% by visual estimate. 1.  Out of hospital cardiac arrest due to anterior STEMI. 2.  Left dominant coronary arteries with significant three-vessel coronary artery disease.  The culprit is thrombotic occlusion of the proximal LAD which was treated successfully with PCI and drug-eluting stent placement.  There is 70% stenosis in the distal left circumflex at the bifurcation of OM 3 which likely can be treated medically.  The RCA is subtotally occluded but it is small and nondominant. 3.  Moderately to severely reduced LV systolic function with severely elevated left ventricular end-diastolic pressure. Recommendations: Continue dual antiplatelet therapy for at least 12 months. Continue cangrelor infusion for another 3 hours. The patient was given 1 dose of IV furosemide with excellent urine output. Vent management per critical care.   DG Abdomen 1 View  Result Date: 07/22/2022 CLINICAL DATA:  Evaluate OG tube. EXAM: ABDOMEN - 1 VIEW COMPARISON:  None Available. FINDINGS: The distal tip of the OG tube is approximally 2.5 cm above the GE junction. IMPRESSION: The distal tip of the OG tube is approximately 2.5 cm above the GE junction. Recommend advancing approximately 13 cm. Electronically Signed   By: Gerome Sam III M.D.   On: 07/22/2022 15:19   DG Chest Port 1 View  Result Date: 07/22/2022 CLINICAL DATA:  Post intubation.  OG tube placement. EXAM: PORTABLE CHEST 1 VIEW COMPARISON:  None Available. FINDINGS: The ETT terminates 1.7 cm proximal above the carina. Recommend withdrawing 1 cm. The NG tube is not identified on this chest x-ray. The heart, hila, mediastinum are normal. Bilateral pulmonary opacities could represent edema versus multifocal infiltrate/infection. Recommend clinical correlation. No other abnormalities. IMPRESSION: 1. The ETT terminates 1.7 cm proximal to the carina. Recommend withdrawing 1 cm. 2. The NG tube is not identified on this chest x-ray. Recommend clinical correlation. 3. Bilateral pulmonary opacities could represent edema versus multifocal infiltrate/infection. Electronically Signed   By: Gerome Sam III M.D.   On: 07/22/2022 15:19    Cardiac Studies   Echo 07/23/22:  IMPRESSIONS    1. There is moderate septal hypertrophy with otherwise mild concentric  LVH. Left ventricular ejection fraction, by estimation, is 35 to 40%. The  left ventricle has moderately decreased function. The left ventricle  demonstrates regional wall motion  abnormalities (see scoring diagram/findings for description). There is  moderate left ventricular hypertrophy of the septal segment. Left  ventricular  diastolic parameters are consistent with Grade I diastolic  dysfunction (impaired relaxation).   2. Right ventricular systolic function is normal. The right ventricular  size is normal. There is normal pulmonary artery systolic pressure.   3. Left atrial size was mildly dilated.   4. The mitral valve is normal in structure. Trivial mitral valve  regurgitation. No evidence of mitral stenosis.   5. The aortic valve is tricuspid. Aortic valve regurgitation is not  visualized. No aortic stenosis is present.   6. The inferior vena cava is normal in size with greater than 50%  respiratory  variability, suggesting right atrial pressure of 3 mmHg.   LHC 07/22/22:   Prox RCA lesion is 90% stenosed.   Prox RCA to Mid RCA lesion is 99% stenosed.   RV Branch lesion is 99% stenosed.   1st Mrg lesion is 60% stenosed.   Mid Cx lesion is 40% stenosed.   Prox LAD lesion is 100% stenosed.   Dist Cx lesion is 70% stenosed.   3rd Mrg lesion is 40% stenosed.   A drug-eluting stent was successfully placed using a STENT ONYX FRONTIER 3.5X18.   Post intervention, there is a 0% residual stenosis.   There is moderate to severe left ventricular systolic dysfunction.   LV end diastolic pressure is severely elevated.   The left ventricular ejection fraction is 25-35% by visual estimate.   1.  Out of hospital cardiac arrest due to anterior STEMI. 2.  Left dominant coronary arteries with significant three-vessel coronary artery disease.  The culprit is thrombotic occlusion of the proximal LAD which was treated successfully with PCI and drug-eluting stent placement.  There is 70% stenosis in the distal left circumflex at the bifurcation of OM 3 which likely can be treated medically.  The RCA is subtotally occluded but it is small and nondominant. 3.  Moderately to severely reduced LV systolic function with severely elevated left ventricular end-diastolic pressure. 4.  Delay in door to device time due to the need to stabilize the patient and intubate.   Recommendations: Continue dual antiplatelet therapy for at least 12 months. Continue cangrelor infusion for another 3 hours. The patient was given 1 dose of IV furosemide with excellent urine output. Vent management per critical care.    Patient Profile     47 y.o. male with history of tobacco and cocaine abuse admitted with out-of-hospital cardiac arrest.  He was defibrillated in the field for ventricular fibrillation.  Taken to the Cath Lab and found to have multivessel disease with a culprit lesion in the proximal LAD.  Underwent successful  PCI.  Assessment & Plan    # Anterior STEMI: High-sensitivity troponin was over 24,000.  He was found to have multivessel disease at cath.  He had 100% proximal LAD lesion.  This was successfully opened with an Onyx drug-eluting stent.  In addition, there was 70% stenosis in the distal left circumflex and subtotally occluded small nondominant right coronary artery.  Medical therapy is planned for his residual coronary artery disease. Continue aspirin indefinitely and ticagrelor for at least 12 months. Transfer to 2A.  # VF Arrest:  Underwent successful defibrillation in the field.  Underwent PCI as above.  Started on carvedilol.  No significant anoxic encephalopathy noted.  # Hyperlipidemia: Continue atorvastatin.  # Acute systolic and diastolic heart failure: LVEF 35-40% on echo.  I increased carvedilol to 6.25 mg twice daily and added losartan 25 mg once daily.  # Polysubstance abuse: I discussed the importance of  smoking cessation and stopping cocaine.  # Transaminitis:  AST and ALT elevated.  Likely shock liver in the setting of VF arrest.  Okay to continue atorvastatin and will need to monitor closely.  # Fever:  Low-grade fever likely reactive.  No evidence of infection at this point.     For questions or updates, please contact [facility] Please consult [email or web address] for contact info under        Signed, Lorine Bears, MD  07/24/2022, 1:22 PM

## 2022-07-24 NOTE — Progress Notes (Signed)
PHARMACY CONSULT NOTE  Pharmacy Consult for Electrolyte Monitoring and Replacement   Recent Labs: Potassium (mmol/L)  Date Value  07/24/2022 3.6  08/28/2013 3.7   Magnesium (mg/dL)  Date Value  16/11/9602 2.3   Calcium (mg/dL)  Date Value  54/10/8117 8.4 (L)   Calcium, Total (mg/dL)  Date Value  14/78/2956 8.9   Albumin (g/dL)  Date Value  21/30/8657 3.6  08/28/2013 3.9   Phosphorus (mg/dL)  Date Value  84/69/6295 3.6   Sodium (mmol/L)  Date Value  07/24/2022 140  08/28/2013 137     Assessment: 47 y.o. male who presents after reported witnessed arrest in front of family. Pharmacy is asked to follow and replace electrolytes while in CCU  Goal of Therapy:  Potassium 4.0 - 5.1 mmol/L Magnesium 2.0 - 2.4 mg/dL All Other Electrolytes WNL  Plan:  ---40 mEq KCl PO x 2 ---recheck electrolytes in am  Jaynie Bream ,PharmD Clinical Pharmacist 07/24/2022 7:51 AM

## 2022-07-24 NOTE — Progress Notes (Signed)
PROGRESS NOTE    Terrance Thompson  JXB:[id number] DOB: 30-Dec-1975 DOA: 07/22/2022 PCP: Patient, No Pcp Per    Brief Narrative:   47 year old male with essentially no previous medical history, tobacco smoking and cocaine abuse who came in after having acute anterior myocardial infarction. He apparently had exertional chest pain for some time. He sustained LOC while home with family and they started CPR on him.  EMS did perform defibrillation for VF with ACLS and subsequent ROSC.  Patient was severely agitated required emergent cardiac evaluation.  Status post consultation with cardiology and immediate cardiac cath was ordered however patient progressively became more agitated with requirement for endotracheal intubation.  Intraoperatively he had successful PCI and is arriving to medical intensive care unit on mechanical ventilation for medical management and weaning.     07/23/22 - Patient s/p liberation from mechanical ventilator and is being optimized medically for TRH transfer. CBC & BMP stable this am. PT /OT initiated. Cardiology on board appreciate input.  6/3: Care transferred to [facility].  Patient stable this morning.  Complains of chest wall pain as a result of CPR   Assessment & Plan:   Principal Problem:   Cardiac arrest [facility]) Active Problems:   Acute ST elevation myocardial infarction (STEMI) of anterior wall (HCC)  Acute anterior myocardial infarction VF arrest Multivessel coronary artery disease Status post left heart cath.  100% occlusion of LAD.  Drug-eluting stent placed x 1.  Appreciate cardiology input. Plan: DAPT aspirin and Brilinta for 12 months followed by aspirin monotherapy Coreg 6.25 mg twice daily  Acute systolic and diastolic congestive heart failure Left ventricular ejection fraction 35 to 40% on echocardiogram.  Carvedilol increased to 6.25 twice daily.  Losartan 25 mg daily  Hyperlipidemia atorvastatin  Polysubstance use Educated on cessation  Elevated  LFTs Likely mild ischemic hepatopathy in the setting of recent cardiac arrest Okay for statin  Low-grade fever On empiric Unasyn.  Low suspicion for sepsis at this point.   DVT prophylaxis: Heparin Code Status: Full Family Communication: Spouse at bedside 6/3 Disposition Plan: Status is: Inpatient Remains inpatient appropriate because: STEMI status post cath and DES deployment.  Stable.  Anticipate discharge 6/4.   Level of care: Progressive  Consultants:  Cardiology  Procedures:  Left heart catheterization.  PCI with DES  Antimicrobials: Unasyn   Subjective: Seen and examined.  Seen in bed.  Endorses chest wall pain after compressions.  Objective: Vitals:   07/24/22 1040 07/24/22 1100 07/24/22 1200 07/24/22 1300  BP:  137/84 138/80 (!) 140/80  Pulse: 79 82 83 90  Resp: 17 (!) 21 18 (!) 29  Temp:      TempSrc:      SpO2: 96% 94% 97% 96%  Weight:      Height:        Intake/Output Summary (Last 24 hours) at 07/24/2022 1334 Last data filed at 07/24/2022 1316 Gross per 24 hour  Intake 1214.98 ml  Output 675 ml  Net 539.98 ml   Filed Weights   07/22/22 1445 07/22/22 1642  Weight: 103.4 kg 97.2 kg    Examination:  General exam: Appears calm and comfortable  Respiratory system: Clear to auscultation. Respiratory effort normal. Cardiovascular system: S1-S2, RRR, no murmurs, no pedal edema Gastrointestinal system: Thin, soft, NT/ND, normal bowel sounds Central nervous system: Alert and oriented. No focal neurological deficits. Extremities: Symmetric 5 x 5 power. Skin: No rashes, lesions or ulcers Psychiatry: Judgement and insight appear normal. Mood & affect appropriate.  Data Reviewed: I have personally reviewed following labs and imaging studies  CBC: Recent Labs  Lab 07/22/22 1447 07/22/22 1552 07/23/22 0320 07/24/22 0511  WBC 10.9*  --  9.2 8.8  NEUTROABS 5.1  --   --   --   HGB 16.3 15.6 15.3 13.6  HCT 48.3 46.0 45.0 39.9  MCV 88.0  --  86.4  87.9  PLT 287  --  252 179   Basic Metabolic Panel: Recent Labs  Lab 07/22/22 1447 07/22/22 1552 07/22/22 1814 07/23/22 0320 07/24/22 0511  NA 139 140 138 139 140  K 3.6 3.7 3.6 3.5 3.6  CL 108  --  106 107 108  CO2 22  --  22 21* 23  GLUCOSE 149*  --  104* 98 102*  BUN 14  --  16 19 22*  CREATININE 1.06  --  1.01 1.16 1.20  CALCIUM 9.1  --  8.8* 8.7* 8.4*  MG  --   --  2.3 2.0 2.3  PHOS  --   --  5.1* 4.9* 3.6   GFR: Estimated Creatinine Clearance: 89.9 mL/min (by C-G formula based on SCr of 1.2 mg/dL). Liver Function Tests: Recent Labs  Lab 07/22/22 1447 07/22/22 1814 07/23/22 0320 07/24/22 0511  AST 51* 249* 240* 75*  ALT 36 68* 75* 47*  ALKPHOS 84 83 75 73  BILITOT 0.7 1.0 0.7 1.3*  PROT 7.5 7.4 7.0 6.8  ALBUMIN 4.3 4.3 3.9 3.6   Recent Labs  Lab 07/22/22 1814  LIPASE 37   No results for input(s): "AMMONIA" in the last 168 hours. Coagulation Profile: Recent Labs  Lab 07/22/22 1447  INR 1.0   Cardiac Enzymes: No results for input(s): "CKTOTAL", "CKMB", "CKMBINDEX", "TROPONINI" in the last 168 hours. BNP (last 3 results) No results for input(s): "PROBNP" in the last 8760 hours. HbA1C: No results for input(s): "HGBA1C" in the last 72 hours. CBG: Recent Labs  Lab 07/23/22 1601 07/23/22 1927 07/23/22 2359 07/24/22 0722 07/24/22 1121  GLUCAP 113* 134* 91 106* 112*   Lipid Profile: Recent Labs    07/22/22 1447  CHOL 204*  HDL 36*  LDLCALC 134*  TRIG 171*  CHOLHDL 5.7   Thyroid Function Tests: No results for input(s): "TSH", "T4TOTAL", "FREET4", "T3FREE", "THYROIDAB" in the last 72 hours. Anemia Panel: No results for input(s): "VITAMINB12", "FOLATE", "FERRITIN", "TIBC", "IRON", "RETICCTPCT" in the last 72 hours. Sepsis Labs: Recent Labs  Lab 07/22/22 1814 07/23/22 0320 07/24/22 0511  PROCALCITON <0.10 0.19 <0.10    Recent Results (from the past 240 hour(s))  MRSA Next Gen by PCR, Nasal     Status: None   Collection Time: 07/22/22   4:46 PM   Specimen: Nasal Mucosa; Nasal Swab  Result Value Ref Range Status   MRSA by PCR Next Gen NOT DETECTED NOT DETECTED Final    Comment: (NOTE) The GeneXpert MRSA Assay (FDA approved for NASAL specimens only), is one component of a comprehensive MRSA colonization surveillance program. It is not intended to diagnose MRSA infection nor to guide or monitor treatment for MRSA infections. Test performance is not FDA approved in patients less than 15 years old. Performed at [facility], [address], [place], [place] [address]          Radiology Studies: ECHOCARDIOGRAM COMPLETE  Result Date: 07/23/2022    ECHOCARDIOGRAM REPORT   Patient Name:   Hason ALLANTE MOUSEL Date of Exam: 07/23/2022 Medical Rec #:  [id number]    Height:  72.0 in Accession #:    [id number]   Weight:       214.3 lb Date of Birth:  [birth year]     BSA:          2.194 m Patient Age:    47 years     BP:           193/127 mmHg Patient Gender: M            HR:           86 bpm. Exam Location:  [facility] Procedure: 2D Echo, Cardiac Doppler and Color Doppler Indications:    Acute myocardial infarction I21.9  History:        Patient has no prior history of Echocardiogram examinations.                 Acute MI; Cocaine abuse.  Sonographer:    Dondra Prader RVT RCS Referring Phys: 50 MUHAMMAD A ARIDA  Sonographer Comments: Technically challenging due to patient on vent and unable to reposition IMPRESSIONS  1. There is moderate septal hypertrophy with otherwise mild concentric LVH. Left ventricular ejection fraction, by estimation, is 35 to 40%. The left ventricle has moderately decreased function. The left ventricle demonstrates regional wall motion abnormalities (see scoring diagram/findings for description). There is moderate left ventricular hypertrophy of the septal segment. Left ventricular diastolic parameters are consistent with Grade I diastolic dysfunction (impaired relaxation).  2. Right ventricular systolic function is  normal. The right ventricular size is normal. There is normal pulmonary artery systolic pressure.  3. Left atrial size was mildly dilated.  4. The mitral valve is normal in structure. Trivial mitral valve regurgitation. No evidence of mitral stenosis.  5. The aortic valve is tricuspid. Aortic valve regurgitation is not visualized. No aortic stenosis is present.  6. The inferior vena cava is normal in size with greater than 50% respiratory variability, suggesting right atrial pressure of 3 mmHg. FINDINGS  Left Ventricle: There is moderate septal hypertrophy with otherwise mild concentric LVH. Left ventricular ejection fraction, by estimation, is 35 to 40%. The left ventricle has moderately decreased function. The left ventricle demonstrates regional wall  motion abnormalities. The left ventricular internal cavity size was normal in size. There is moderate left ventricular hypertrophy of the septal segment. Left ventricular diastolic parameters are consistent with Grade I diastolic dysfunction (impaired relaxation). Normal left ventricular filling pressure.  LV Wall Scoring: The entire anterior wall, anterior septum, and apex are hypokinetic. The entire lateral wall, inferior septum, and entire inferior wall are normal. Right Ventricle: The right ventricular size is normal. No increase in right ventricular wall thickness. Right ventricular systolic function is normal. There is normal pulmonary artery systolic pressure. The tricuspid regurgitant velocity is 1.33 m/s, and  with an assumed right atrial pressure of 3 mmHg, the estimated right ventricular systolic pressure is 10.1 mmHg. Left Atrium: Left atrial size was mildly dilated. Right Atrium: Right atrial size was normal in size. Pericardium: There is no evidence of pericardial effusion. Mitral Valve: The mitral valve is normal in structure. Trivial mitral valve regurgitation. No evidence of mitral valve stenosis. Tricuspid Valve: The tricuspid valve is normal in  structure. Tricuspid valve regurgitation is trivial. No evidence of tricuspid stenosis. Aortic Valve: The aortic valve is tricuspid. Aortic valve regurgitation is not visualized. No aortic stenosis is present. Aortic valve mean gradient measures 4.0 mmHg. Aortic valve peak gradient measures 7.6 mmHg. Aortic valve area, by VTI measures 3.71 cm. Pulmonic Valve: The pulmonic valve  was normal in structure. Pulmonic valve regurgitation is not visualized. No evidence of pulmonic stenosis. Aorta: The aortic root is normal in size and structure. Venous: The inferior vena cava is normal in size with greater than 50% respiratory variability, suggesting right atrial pressure of 3 mmHg. IAS/Shunts: No atrial level shunt detected by color flow Doppler.  LEFT VENTRICLE PLAX 2D LVIDd:         4.65 cm   Diastology LVIDs:         3.80 cm   LV e' medial:    5.66 cm/s LV PW:         1.00 cm   LV E/e' medial:  7.3 LV IVS:        1.25 cm   LV e' lateral:   4.46 cm/s LVOT diam:     2.10 cm   LV E/e' lateral: 9.3 LV SV:         67 LV SV Index:   30 LVOT Area:     3.46 cm  RIGHT VENTRICLE RV Basal diam:  2.40 cm RV S prime:     14.40 cm/s TAPSE (M-mode): 1.7 cm LEFT ATRIUM             Index        RIGHT ATRIUM           Index LA diam:        3.60 cm 1.64 cm/m   RA Area:     10.50 cm LA Vol (A2C):   57.0 ml 25.98 ml/m  RA Volume:   18.60 ml  8.48 ml/m LA Vol (A4C):   72.8 ml 33.18 ml/m LA Biplane Vol: 64.6 ml 29.44 ml/m  AORTIC VALVE                    PULMONIC VALVE AV Area (Vmax):    3.16 cm     PV Vmax:       0.89 m/s AV Area (Vmean):   3.11 cm     PV Peak grad:  3.2 mmHg AV Area (VTI):     3.71 cm AV Vmax:           138.00 cm/s AV Vmean:          90.500 cm/s AV VTI:            0.180 m AV Peak Grad:      7.6 mmHg AV Mean Grad:      4.0 mmHg LVOT Vmax:         126.00 cm/s LVOT Vmean:        81.200 cm/s LVOT VTI:          0.193 m LVOT/AV VTI ratio: 1.07  AORTA Ao Root diam: 3.60 cm MITRAL VALVE               TRICUSPID VALVE MV  Area (PHT): 4.52 cm    TR Peak grad:   7.1 mmHg MV Decel Time: 168 msec    TR Vmax:        133.00 cm/s MV E velocity: 41.40 cm/s MV A velocity: 83.30 cm/s  SHUNTS MV E/A ratio:  0.50        Systemic VTI:  0.19 m                            Systemic Diam: 2.10 cm Chilton Si MD Electronically signed by Chilton Si MD Signature Date/Time: 07/23/2022/9:28:19 AM  Final    DG Abd 1 View  Result Date: 07/22/2022 CLINICAL DATA:  Evaluate NG tube placement EXAM: ABDOMEN - 1 VIEW COMPARISON:  None Available. FINDINGS: The NG tube terminates in the stomach. IMPRESSION: The NG tube terminates in the stomach. Electronically Signed   By: Gerome Sam III M.D.   On: 07/22/2022 17:52   CARDIAC CATHETERIZATION  Addendum Date: 07/22/2022     Prox RCA lesion is 90% stenosed.   Prox RCA to Mid RCA lesion is 99% stenosed.   RV Branch lesion is 99% stenosed.   1st Mrg lesion is 60% stenosed.   Mid Cx lesion is 40% stenosed.   Prox LAD lesion is 100% stenosed.   Dist Cx lesion is 70% stenosed.   3rd Mrg lesion is 40% stenosed.   A drug-eluting stent was successfully placed using a STENT ONYX FRONTIER 3.5X18.   Post intervention, there is a 0% residual stenosis.   There is moderate to severe left ventricular systolic dysfunction.   LV end diastolic pressure is severely elevated.   The left ventricular ejection fraction is 25-35% by visual estimate. 1.  Out of hospital cardiac arrest due to anterior STEMI. 2.  Left dominant coronary arteries with significant three-vessel coronary artery disease.  The culprit is thrombotic occlusion of the proximal LAD which was treated successfully with PCI and drug-eluting stent placement.  There is 70% stenosis in the distal left circumflex at the bifurcation of OM 3 which likely can be treated medically.  The RCA is subtotally occluded but it is small and nondominant. 3.  Moderately to severely reduced LV systolic function with severely elevated left ventricular end-diastolic  pressure. 4.  Delay in door to device time due to the need to stabilize the patient and intubate. Recommendations: Continue dual antiplatelet therapy for at least 12 months. Continue cangrelor infusion for another 3 hours. The patient was given 1 dose of IV furosemide with excellent urine output. Vent management per critical care.  Result Date: 07/22/2022   Prox RCA lesion is 90% stenosed.   Prox RCA to Mid RCA lesion is 99% stenosed.   RV Branch lesion is 99% stenosed.   1st Mrg lesion is 60% stenosed.   Mid Cx lesion is 40% stenosed.   Prox LAD lesion is 100% stenosed.   Dist Cx lesion is 70% stenosed.   3rd Mrg lesion is 40% stenosed.   A drug-eluting stent was successfully placed using a STENT ONYX FRONTIER 3.5X18.   Post intervention, there is a 0% residual stenosis.   There is moderate to severe left ventricular systolic dysfunction.   LV end diastolic pressure is severely elevated.   The left ventricular ejection fraction is 25-35% by visual estimate. 1.  Out of hospital cardiac arrest due to anterior STEMI. 2.  Left dominant coronary arteries with significant three-vessel coronary artery disease.  The culprit is thrombotic occlusion of the proximal LAD which was treated successfully with PCI and drug-eluting stent placement.  There is 70% stenosis in the distal left circumflex at the bifurcation of OM 3 which likely can be treated medically.  The RCA is subtotally occluded but it is small and nondominant. 3.  Moderately to severely reduced LV systolic function with severely elevated left ventricular end-diastolic pressure. Recommendations: Continue dual antiplatelet therapy for at least 12 months. Continue cangrelor infusion for another 3 hours. The patient was given 1 dose of IV furosemide with excellent urine output. Vent management per critical care.   DG Abdomen 1 View  Result Date: 07/22/2022 CLINICAL DATA:  Evaluate OG tube. EXAM: ABDOMEN - 1 VIEW COMPARISON:  None Available. FINDINGS: The distal  tip of the OG tube is approximally 2.5 cm above the GE junction. IMPRESSION: The distal tip of the OG tube is approximately 2.5 cm above the GE junction. Recommend advancing approximately 13 cm. Electronically Signed   By: Gerome Sam III M.D.   On: 07/22/2022 15:19   DG Chest Port 1 View  Result Date: 07/22/2022 CLINICAL DATA:  Post intubation.  OG tube placement. EXAM: PORTABLE CHEST 1 VIEW COMPARISON:  None Available. FINDINGS: The ETT terminates 1.7 cm proximal above the carina. Recommend withdrawing 1 cm. The NG tube is not identified on this chest x-ray. The heart, hila, mediastinum are normal. Bilateral pulmonary opacities could represent edema versus multifocal infiltrate/infection. Recommend clinical correlation. No other abnormalities. IMPRESSION: 1. The ETT terminates 1.7 cm proximal to the carina. Recommend withdrawing 1 cm. 2. The NG tube is not identified on this chest x-ray. Recommend clinical correlation. 3. Bilateral pulmonary opacities could represent edema versus multifocal infiltrate/infection. Electronically Signed   By: Gerome Sam III M.D.   On: 07/22/2022 15:19        Scheduled Meds:  aspirin  81 mg Oral Daily   atorvastatin  80 mg Oral Daily   carvedilol  6.25 mg Oral BID WC   Chlorhexidine Gluconate Cloth  6 each Topical Q0600   enoxaparin (LOVENOX) injection  40 mg Subcutaneous Q24H   famotidine  20 mg Oral BID   ketorolac  15 mg Intravenous Q6H   lidocaine  1 patch Transdermal Q24H   losartan  25 mg Oral Daily   nicotine  21 mg Transdermal Daily   pantoprazole (PROTONIX) IV  40 mg Intravenous Q24H   potassium chloride  40 mEq Oral BID   sodium chloride flush  3 mL Intravenous Q12H   ticagrelor  90 mg Oral BID   Continuous Infusions:  sodium chloride Stopped (07/23/22 1156)   sodium chloride     sodium chloride     ampicillin-sulbactam (UNASYN) IV Stopped (07/24/22 0851)     LOS: 2 days    Tresa Moore, MD [facility]   If  7PM-7AM, please contact night-coverage  07/24/2022, 1:34 PM

## 2022-07-24 NOTE — Progress Notes (Signed)
   07/24/22 1000  Spiritual Encounters  Type of Visit Follow up  Referral source IDT Rounds  Reason for visit Routine spiritual support  OnCall Visit Yes  Spiritual Framework  Presenting Themes Goals in life/care;Values and beliefs;Impactful experiences and emotions;Community and relationships;Significant life change  Community/Connection Family;Friend(s);Significant other  Family Stress Factors None identified  Interventions  Spiritual Care Interventions Made Established relationship of care and support;Compassionate presence;Reflective listening;Explored values/beliefs/practices/strengths;Encouragement  Intervention Outcomes  Outcomes Connection to spiritual care;Awareness of health;Awareness around self/spiritual resourses  Spiritual Care Plan  Spiritual Care Issues Still Outstanding No further spiritual care needs at this time (see row info)    Chaplain visited patient for a follow up visit. Patients girlfriend was in the room with him and shared that I was there with there family on Saturday when he was first brought to the hospital. I expressed that I was happy to see he was doing much better. Patients girlfriend gave me a hug and thanked me for all of the support and the prayers that were offered. Patient thinks he will be going home in a few days. Patient also spoke with me about slowing down and trying to quit smoking. Patient stated that he is glad that God kept him here.

## 2022-07-25 ENCOUNTER — Telehealth: Payer: Self-pay | Admitting: Nurse Practitioner

## 2022-07-25 ENCOUNTER — Other Ambulatory Visit (HOSPITAL_COMMUNITY): Payer: Self-pay

## 2022-07-25 ENCOUNTER — Other Ambulatory Visit: Payer: Self-pay

## 2022-07-25 DIAGNOSIS — I5020 Unspecified systolic (congestive) heart failure: Secondary | ICD-10-CM

## 2022-07-25 LAB — HEMOGLOBIN A1C
Hgb A1c MFr Bld: 5.6 % (ref 4.8–5.6)
Mean Plasma Glucose: 114 mg/dL

## 2022-07-25 LAB — BASIC METABOLIC PANEL
Anion gap: 8 (ref 5–15)
BUN: 18 mg/dL (ref 6–20)
CO2: 23 mmol/L (ref 22–32)
Calcium: 8.6 mg/dL — ABNORMAL LOW (ref 8.9–10.3)
Chloride: 110 mmol/L (ref 98–111)
Creatinine, Ser: 0.93 mg/dL (ref 0.61–1.24)
GFR, Estimated: >60 mL/min (ref >60)
Glucose, Bld: 111 mg/dL — ABNORMAL HIGH (ref 70–99)
Potassium: 4 mmol/L (ref 3.5–5.1)
Sodium: 141 mmol/L (ref 135–145)

## 2022-07-25 LAB — GLUCOSE, CAPILLARY
Glucose-Capillary: 103 mg/dL — ABNORMAL HIGH (ref 70–99)
Glucose-Capillary: 81 mg/dL (ref 70–99)
Glucose-Capillary: 99 mg/dL (ref 70–99)

## 2022-07-25 LAB — MAGNESIUM: Magnesium: 2.2 mg/dL (ref 1.7–2.4)

## 2022-07-25 MED ORDER — TICAGRELOR 90 MG PO TABS
90.0000 mg | ORAL_TABLET | Freq: Two times a day (BID) | ORAL | 0 refills | Status: DC
  Filled 2022-07-25: qty 60, 30d supply, fill #0

## 2022-07-25 MED ORDER — ASPIRIN 81 MG PO CHEW
81.0000 mg | CHEWABLE_TABLET | Freq: Every day | ORAL | 0 refills | Status: AC
  Filled 2022-07-25: qty 30, 30d supply, fill #0

## 2022-07-25 MED ORDER — ATORVASTATIN CALCIUM 80 MG PO TABS
80.0000 mg | ORAL_TABLET | Freq: Every day | ORAL | 0 refills | Status: DC
  Filled 2022-07-25: qty 30, 30d supply, fill #0

## 2022-07-25 MED ORDER — CARVEDILOL 6.25 MG PO TABS
6.2500 mg | ORAL_TABLET | Freq: Two times a day (BID) | ORAL | 0 refills | Status: DC
  Filled 2022-07-25: qty 60, 30d supply, fill #0

## 2022-07-25 MED ORDER — SPIRONOLACTONE 12.5 MG HALF TABLET
12.5000 mg | ORAL_TABLET | Freq: Every day | ORAL | Status: DC
  Filled 2022-07-25: qty 1

## 2022-07-25 MED ORDER — NICOTINE 21 MG/24HR TD PT24
21.0000 mg | MEDICATED_PATCH | Freq: Every day | TRANSDERMAL | 0 refills | Status: AC
  Filled 2022-07-25: qty 14, 14d supply, fill #0

## 2022-07-25 MED ORDER — LOSARTAN POTASSIUM 25 MG PO TABS
25.0000 mg | ORAL_TABLET | Freq: Every day | ORAL | 0 refills | Status: DC
  Filled 2022-07-25: qty 30, 30d supply, fill #0

## 2022-07-25 MED ORDER — SPIRONOLACTONE 25 MG PO TABS
12.5000 mg | ORAL_TABLET | Freq: Every day | ORAL | 0 refills | Status: DC
  Filled 2022-07-25: qty 15, 30d supply, fill #0

## 2022-07-25 MED ORDER — PANTOPRAZOLE SODIUM 40 MG PO TBEC
40.0000 mg | DELAYED_RELEASE_TABLET | Freq: Every day | ORAL | Status: DC
  Administered 2022-07-25: 40 mg via ORAL
  Filled 2022-07-25: qty 1

## 2022-07-25 MED ORDER — AMOXICILLIN-POT CLAVULANATE 875-125 MG PO TABS
1.0000 | ORAL_TABLET | Freq: Two times a day (BID) | ORAL | 0 refills | Status: AC
  Filled 2022-07-25: qty 8, 4d supply, fill #0

## 2022-07-25 NOTE — Discharge Summary (Signed)
Physician Discharge Summary  ZAVON GAUSMAN NUU:[id number] DOB: Nov 22, 1975 DOA: 07/22/2022  PCP: Patient, No Pcp Per  Admit date: 07/22/2022 Discharge date: 07/25/2022  Admitted From: Home Disposition:  Home  Recommendations for Outpatient Follow-up:  Follow up with PCP in 1-2 weeks Follow-up with cardiology 1 to 2 weeks  Home Health: No Equipment/Devices: None  Discharge Condition: Stable CODE STATUS: Full Diet recommendation: Heart healthy  Brief/Interim Summary:  47 year old male with essentially no previous medical history, tobacco smoking and cocaine abuse who came in after having acute anterior myocardial infarction. He apparently had exertional chest pain for some time. He sustained LOC while home with family and they started CPR on him.  EMS did perform defibrillation for VF with ACLS and subsequent ROSC.  Patient was severely agitated required emergent cardiac evaluation.  Status post consultation with cardiology and immediate cardiac cath was ordered however patient progressively became more agitated with requirement for endotracheal intubation.  Intraoperatively he had successful PCI and is arriving to medical intensive care unit on mechanical ventilation for medical management and weaning.     07/23/22 - Patient s/p liberation from mechanical ventilator and is being optimized medically for TRH transfer. CBC & BMP stable this am. PT /OT initiated. Cardiology on board appreciate input.  6/3: Care transferred to [facility].  Patient stable this morning.  Complains of chest wall pain as a result of CPR 6/4: Stable on day of discharge.  Chest wall pain improving.  On goal-directed medical therapy.  DAPT aspirin and Brilinta, beta-blocker, ACE inhibitor, Aldactone.  Medically stable.  Counseled on substance abuse cessation.  Cleared for discharge.  Follow-up outpatient cardiology 1 to 2 weeks.    Discharge Diagnoses:  Principal Problem:   Cardiac arrest [facility]) Active Problems:   Acute ST  elevation myocardial infarction (STEMI) of anterior wall (HCC)  Acute anterior myocardial infarction VF arrest Multivessel coronary artery disease Status post left heart cath.  100% occlusion of LAD.  Drug-eluting stent placed x 1.  Appreciate cardiology input. Plan: DAPT aspirin and Brilinta for 12 months followed by aspirin monotherapy Coreg 6.25 mg twice daily   Acute systolic and diastolic congestive heart failure Left ventricular ejection fraction 35 to 40% on echocardiogram.  Carvedilol increased to 6.25 twice daily.  Losartan 25 mg daily.  Aldactone 12.5 mg daily   Hyperlipidemia atorvastatin   Polysubstance use Educated on cessation   Elevated LFTs Likely mild ischemic hepatopathy in the setting of recent cardiac arrest Okay for statin   Low-grade fever On empiric Unasyn.  Unable to totally exclude aspiration.  Will treat empirically with course of Augmentin  Discharge Instructions  Discharge Instructions     AMB Referral to Cardiac Rehabilitation - Phase II   Complete by: As directed    Diagnosis:  Coronary Stents STEMI     After initial evaluation and assessments completed: Virtual Based Care may be provided alone or in conjunction with Phase 2 Cardiac Rehab based on patient barriers.: Yes   Intensive Cardiac Rehabilitation (ICR) [facility] location only OR Traditional Cardiac Rehabilitation (TCR) *If criteria for ICR are not met will enroll in TCR [facility] only): Yes   Diet - low sodium heart healthy   Complete by: As directed    Increase activity slowly   Complete by: As directed    No wound care   Complete by: As directed       Allergies as of 07/25/2022   No Known Allergies      Medication List  STOP taking these medications    HYDROcodone-homatropine 5-1.5 MG/5ML syrup Commonly known as: HYCODAN       TAKE these medications    amoxicillin-clavulanate 875-125 MG tablet Commonly known as: AUGMENTIN Take 1 tablet by mouth 2 (two) times daily for 4  days.   aspirin 81 MG chewable tablet Chew 1 tablet (81 mg total) by mouth daily. Start taking on: July 26, 2022   atorvastatin 80 MG tablet Commonly known as: LIPITOR Take 1 tablet (80 mg total) by mouth daily. Start taking on: July 26, 2022   Brilinta 90 MG Tabs tablet Generic drug: ticagrelor Take 1 tablet (90 mg total) by mouth 2 (two) times daily.   carvedilol 6.25 MG tablet Commonly known as: COREG Take 1 tablet (6.25 mg total) by mouth 2 (two) times daily with a meal.   losartan 25 MG tablet Commonly known as: COZAAR Take 1 tablet (25 mg total) by mouth daily. Start taking on: July 26, 2022   Nicotine Step 1 21 mg/24hr patch Generic drug: nicotine Place 1 patch (21 mg total) onto the skin daily.   spironolactone 25 MG tablet Commonly known as: ALDACTONE Take 0.5 tablets (12.5 mg total) by mouth daily.        No Known Allergies  Consultations: Cardiology   Procedures/Studies: ECHOCARDIOGRAM COMPLETE  Result Date: 07/23/2022    ECHOCARDIOGRAM REPORT   Patient Name:   Suleman AVANT SHOAFF Date of Exam: 07/23/2022 Medical Rec #:  [id number]    Height:       72.0 in Accession #:    [id number]   Weight:       214.3 lb Date of Birth:  August 12, 1975     BSA:          2.194 m Patient Age:    47 years     BP:           193/127 mmHg Patient Gender: M            HR:           86 bpm. Exam Location:  [facility] Procedure: 2D Echo, Cardiac Doppler and Color Doppler Indications:    Acute myocardial infarction I21.9  History:        Patient has no prior history of Echocardiogram examinations.                 Acute MI; Cocaine abuse.  Sonographer:    Dondra Prader RVT RCS Referring Phys: 27 MUHAMMAD A ARIDA  Sonographer Comments: Technically challenging due to patient on vent and unable to reposition IMPRESSIONS  1. There is moderate septal hypertrophy with otherwise mild concentric LVH. Left ventricular ejection fraction, by estimation, is 35 to 40%. The left ventricle has moderately decreased function.  The left ventricle demonstrates regional wall motion abnormalities (see scoring diagram/findings for description). There is moderate left ventricular hypertrophy of the septal segment. Left ventricular diastolic parameters are consistent with Grade I diastolic dysfunction (impaired relaxation).  2. Right ventricular systolic function is normal. The right ventricular size is normal. There is normal pulmonary artery systolic pressure.  3. Left atrial size was mildly dilated.  4. The mitral valve is normal in structure. Trivial mitral valve regurgitation. No evidence of mitral stenosis.  5. The aortic valve is tricuspid. Aortic valve regurgitation is not visualized. No aortic stenosis is present.  6. The inferior vena cava is normal in size with greater than 50% respiratory variability, suggesting right atrial pressure of 3 mmHg. FINDINGS  Left Ventricle: There is  moderate septal hypertrophy with otherwise mild concentric LVH. Left ventricular ejection fraction, by estimation, is 35 to 40%. The left ventricle has moderately decreased function. The left ventricle demonstrates regional wall  motion abnormalities. The left ventricular internal cavity size was normal in size. There is moderate left ventricular hypertrophy of the septal segment. Left ventricular diastolic parameters are consistent with Grade I diastolic dysfunction (impaired relaxation). Normal left ventricular filling pressure.  LV Wall Scoring: The entire anterior wall, anterior septum, and apex are hypokinetic. The entire lateral wall, inferior septum, and entire inferior wall are normal. Right Ventricle: The right ventricular size is normal. No increase in right ventricular wall thickness. Right ventricular systolic function is normal. There is normal pulmonary artery systolic pressure. The tricuspid regurgitant velocity is 1.33 m/s, and  with an assumed right atrial pressure of 3 mmHg, the estimated right ventricular systolic pressure is 10.1 mmHg.  Left Atrium: Left atrial size was mildly dilated. Right Atrium: Right atrial size was normal in size. Pericardium: There is no evidence of pericardial effusion. Mitral Valve: The mitral valve is normal in structure. Trivial mitral valve regurgitation. No evidence of mitral valve stenosis. Tricuspid Valve: The tricuspid valve is normal in structure. Tricuspid valve regurgitation is trivial. No evidence of tricuspid stenosis. Aortic Valve: The aortic valve is tricuspid. Aortic valve regurgitation is not visualized. No aortic stenosis is present. Aortic valve mean gradient measures 4.0 mmHg. Aortic valve peak gradient measures 7.6 mmHg. Aortic valve area, by VTI measures 3.71 cm. Pulmonic Valve: The pulmonic valve was normal in structure. Pulmonic valve regurgitation is not visualized. No evidence of pulmonic stenosis. Aorta: The aortic root is normal in size and structure. Venous: The inferior vena cava is normal in size with greater than 50% respiratory variability, suggesting right atrial pressure of 3 mmHg. IAS/Shunts: No atrial level shunt detected by color flow Doppler.  LEFT VENTRICLE PLAX 2D LVIDd:         4.65 cm   Diastology LVIDs:         3.80 cm   LV e' medial:    5.66 cm/s LV PW:         1.00 cm   LV E/e' medial:  7.3 LV IVS:        1.25 cm   LV e' lateral:   4.46 cm/s LVOT diam:     2.10 cm   LV E/e' lateral: 9.3 LV SV:         67 LV SV Index:   30 LVOT Area:     3.46 cm  RIGHT VENTRICLE RV Basal diam:  2.40 cm RV S prime:     14.40 cm/s TAPSE (M-mode): 1.7 cm LEFT ATRIUM             Index        RIGHT ATRIUM           Index LA diam:        3.60 cm 1.64 cm/m   RA Area:     10.50 cm LA Vol (A2C):   57.0 ml 25.98 ml/m  RA Volume:   18.60 ml  8.48 ml/m LA Vol (A4C):   72.8 ml 33.18 ml/m LA Biplane Vol: 64.6 ml 29.44 ml/m  AORTIC VALVE                    PULMONIC VALVE AV Area (Vmax):    3.16 cm     PV Vmax:       0.89 m/s AV Area (  Vmean):   3.11 cm     PV Peak grad:  3.2 mmHg AV Area (VTI):      3.71 cm AV Vmax:           138.00 cm/s AV Vmean:          90.500 cm/s AV VTI:            0.180 m AV Peak Grad:      7.6 mmHg AV Mean Grad:      4.0 mmHg LVOT Vmax:         126.00 cm/s LVOT Vmean:        81.200 cm/s LVOT VTI:          0.193 m LVOT/AV VTI ratio: 1.07  AORTA Ao Root diam: 3.60 cm MITRAL VALVE               TRICUSPID VALVE MV Area (PHT): 4.52 cm    TR Peak grad:   7.1 mmHg MV Decel Time: 168 msec    TR Vmax:        133.00 cm/s MV E velocity: 41.40 cm/s MV A velocity: 83.30 cm/s  SHUNTS MV E/A ratio:  0.50        Systemic VTI:  0.19 m                            Systemic Diam: 2.10 cm Chilton Si MD Electronically signed by Chilton Si MD Signature Date/Time: 07/23/2022/9:28:19 AM    Final    DG Abd 1 View  Result Date: 07/22/2022 CLINICAL DATA:  Evaluate NG tube placement EXAM: ABDOMEN - 1 VIEW COMPARISON:  None Available. FINDINGS: The NG tube terminates in the stomach. IMPRESSION: The NG tube terminates in the stomach. Electronically Signed   By: Gerome Sam III M.D.   On: 07/22/2022 17:52   CARDIAC CATHETERIZATION  Addendum Date: 07/22/2022     Prox RCA lesion is 90% stenosed.   Prox RCA to Mid RCA lesion is 99% stenosed.   RV Branch lesion is 99% stenosed.   1st Mrg lesion is 60% stenosed.   Mid Cx lesion is 40% stenosed.   Prox LAD lesion is 100% stenosed.   Dist Cx lesion is 70% stenosed.   3rd Mrg lesion is 40% stenosed.   A drug-eluting stent was successfully placed using a STENT ONYX FRONTIER 3.5X18.   Post intervention, there is a 0% residual stenosis.   There is moderate to severe left ventricular systolic dysfunction.   LV end diastolic pressure is severely elevated.   The left ventricular ejection fraction is 25-35% by visual estimate. 1.  Out of hospital cardiac arrest due to anterior STEMI. 2.  Left dominant coronary arteries with significant three-vessel coronary artery disease.  The culprit is thrombotic occlusion of the proximal LAD which was treated successfully  with PCI and drug-eluting stent placement.  There is 70% stenosis in the distal left circumflex at the bifurcation of OM 3 which likely can be treated medically.  The RCA is subtotally occluded but it is small and nondominant. 3.  Moderately to severely reduced LV systolic function with severely elevated left ventricular end-diastolic pressure. 4.  Delay in door to device time due to the need to stabilize the patient and intubate. Recommendations: Continue dual antiplatelet therapy for at least 12 months. Continue cangrelor infusion for another 3 hours. The patient was given 1 dose of IV furosemide with excellent urine output. Vent management per critical  care.  Result Date: 07/22/2022   Prox RCA lesion is 90% stenosed.   Prox RCA to Mid RCA lesion is 99% stenosed.   RV Branch lesion is 99% stenosed.   1st Mrg lesion is 60% stenosed.   Mid Cx lesion is 40% stenosed.   Prox LAD lesion is 100% stenosed.   Dist Cx lesion is 70% stenosed.   3rd Mrg lesion is 40% stenosed.   A drug-eluting stent was successfully placed using a STENT ONYX FRONTIER 3.5X18.   Post intervention, there is a 0% residual stenosis.   There is moderate to severe left ventricular systolic dysfunction.   LV end diastolic pressure is severely elevated.   The left ventricular ejection fraction is 25-35% by visual estimate. 1.  Out of hospital cardiac arrest due to anterior STEMI. 2.  Left dominant coronary arteries with significant three-vessel coronary artery disease.  The culprit is thrombotic occlusion of the proximal LAD which was treated successfully with PCI and drug-eluting stent placement.  There is 70% stenosis in the distal left circumflex at the bifurcation of OM 3 which likely can be treated medically.  The RCA is subtotally occluded but it is small and nondominant. 3.  Moderately to severely reduced LV systolic function with severely elevated left ventricular end-diastolic pressure. Recommendations: Continue dual antiplatelet therapy  for at least 12 months. Continue cangrelor infusion for another 3 hours. The patient was given 1 dose of IV furosemide with excellent urine output. Vent management per critical care.   DG Abdomen 1 View  Result Date: 07/22/2022 CLINICAL DATA:  Evaluate OG tube. EXAM: ABDOMEN - 1 VIEW COMPARISON:  None Available. FINDINGS: The distal tip of the OG tube is approximally 2.5 cm above the GE junction. IMPRESSION: The distal tip of the OG tube is approximately 2.5 cm above the GE junction. Recommend advancing approximately 13 cm. Electronically Signed   By: Gerome Sam III M.D.   On: 07/22/2022 15:19   DG Chest Port 1 View  Result Date: 07/22/2022 CLINICAL DATA:  Post intubation.  OG tube placement. EXAM: PORTABLE CHEST 1 VIEW COMPARISON:  None Available. FINDINGS: The ETT terminates 1.7 cm proximal above the carina. Recommend withdrawing 1 cm. The NG tube is not identified on this chest x-ray. The heart, hila, mediastinum are normal. Bilateral pulmonary opacities could represent edema versus multifocal infiltrate/infection. Recommend clinical correlation. No other abnormalities. IMPRESSION: 1. The ETT terminates 1.7 cm proximal to the carina. Recommend withdrawing 1 cm. 2. The NG tube is not identified on this chest x-ray. Recommend clinical correlation. 3. Bilateral pulmonary opacities could represent edema versus multifocal infiltrate/infection. Electronically Signed   By: Gerome Sam III M.D.   On: 07/22/2022 15:19      Subjective: Seen and examined on the day of discharge.  Stable no distress.  Appropriate for discharge home.  Discharge Exam: Vitals:   07/25/22 0326 07/25/22 0855  BP: 126/89 133/83  Pulse: 67 71  Resp: 18 20  Temp: 97.8 F (36.6 C) 98.5 F (36.9 C)  SpO2: 100% 99%   Vitals:   07/24/22 1944 07/25/22 0028 07/25/22 0326 07/25/22 0855  BP: (!) 169/87 (!) 137/98 126/89 133/83  Pulse: 92 71 67 71  Resp: 18 18 18 20   Temp: 98.3 F (36.8 C) 98.6 F (37 C) 97.8 F (36.6  C) 98.5 F (36.9 C)  TempSrc:   Oral   SpO2: 99% 98% 100% 99%  Weight:      Height:  General: Pt is alert, awake, not in acute distress Cardiovascular: RRR, S1/S2 +, no rubs, no gallops Respiratory: CTA bilaterally, no wheezing, no rhonchi Abdominal: Soft, NT, ND, bowel sounds + Extremities: no edema, no cyanosis    The results of significant diagnostics from this hospitalization (including imaging, microbiology, ancillary and laboratory) are listed below for reference.     Microbiology: Recent Results (from the past 240 hour(s))  MRSA Next Gen by PCR, Nasal     Status: None   Collection Time: 07/22/22  4:46 PM   Specimen: Nasal Mucosa; Nasal Swab  Result Value Ref Range Status   MRSA by PCR Next Gen NOT DETECTED NOT DETECTED Final    Comment: (NOTE) The GeneXpert MRSA Assay (FDA approved for NASAL specimens only), is one component of a comprehensive MRSA colonization surveillance program. It is not intended to diagnose MRSA infection nor to guide or monitor treatment for MRSA infections. Test performance is not FDA approved in patients less than 42 years old. Performed at [facility], [address] [place], [place] [address]      Labs: BNP (last 3 results) No results for input(s): "BNP" in the last 8760 hours. Basic Metabolic Panel: Recent Labs  Lab 07/22/22 1447 07/22/22 1552 07/22/22 1814 07/23/22 0320 07/24/22 0511 07/25/22 0614  NA 139 140 138 139 140 141  K 3.6 3.7 3.6 3.5 3.6 4.0  CL 108  --  106 107 108 110  CO2 22  --  22 21* 23 23  GLUCOSE 149*  --  104* 98 102* 111*  BUN 14  --  16 19 22* 18  CREATININE 1.06  --  1.01 1.16 1.20 0.93  CALCIUM 9.1  --  8.8* 8.7* 8.4* 8.6*  MG  --   --  2.3 2.0 2.3 2.2  PHOS  --   --  5.1* 4.9* 3.6  --    Liver Function Tests: Recent Labs  Lab 07/22/22 1447 07/22/22 1814 07/23/22 0320 07/24/22 0511  AST 51* 249* 240* 75*  ALT 36 68* 75* 47*  ALKPHOS 84 83 75 73  BILITOT 0.7 1.0 0.7 1.3*   PROT 7.5 7.4 7.0 6.8  ALBUMIN 4.3 4.3 3.9 3.6   Recent Labs  Lab 07/22/22 1814  LIPASE 37   No results for input(s): "AMMONIA" in the last 168 hours. CBC: Recent Labs  Lab 07/22/22 1447 07/22/22 1552 07/23/22 0320 07/24/22 0511  WBC 10.9*  --  9.2 8.8  NEUTROABS 5.1  --   --   --   HGB 16.3 15.6 15.3 13.6  HCT 48.3 46.0 45.0 39.9  MCV 88.0  --  86.4 87.9  PLT 287  --  252 179   Cardiac Enzymes: No results for input(s): "CKTOTAL", "CKMB", "CKMBINDEX", "TROPONINI" in the last 168 hours. BNP: Invalid input(s): "POCBNP" CBG: Recent Labs  Lab 07/24/22 1809 07/24/22 2033 07/25/22 0031 07/25/22 0327 07/25/22 0857  GLUCAP 109* 122* 81 103* 99   D-Dimer No results for input(s): "DDIMER" in the last 72 hours. Hgb A1c Recent Labs    07/23/22 0320  HGBA1C 5.6   Lipid Profile Recent Labs    07/22/22 1447  CHOL 204*  HDL 36*  LDLCALC 134*  TRIG 171*  CHOLHDL 5.7   Thyroid function studies No results for input(s): "TSH", "T4TOTAL", "T3FREE", "THYROIDAB" in the last 72 hours.  Invalid input(s): "FREET3" Anemia work up No results for input(s): "VITAMINB12", "FOLATE", "FERRITIN", "TIBC", "IRON", "RETICCTPCT" in the last 72 hours. Urinalysis  Component Value Date/Time   COLORURINE YELLOW (A) 07/22/2022 1519   APPEARANCEUR HAZY (A) 07/22/2022 1519   APPEARANCEUR Hazy 08/28/2013 1144   LABSPEC 1.016 07/22/2022 1519   LABSPEC 1.033 08/28/2013 1144   PHURINE 6.0 07/22/2022 1519   GLUCOSEU 50 (A) 07/22/2022 1519   GLUCOSEU Negative 08/28/2013 1144   HGBUR NEGATIVE 07/22/2022 1519   BILIRUBINUR NEGATIVE 07/22/2022 1519   BILIRUBINUR Negative 08/28/2013 1144   KETONESUR NEGATIVE 07/22/2022 1519   PROTEINUR >=300 (A) 07/22/2022 1519   NITRITE NEGATIVE 07/22/2022 1519   LEUKOCYTESUR NEGATIVE 07/22/2022 1519   LEUKOCYTESUR Negative 08/28/2013 1144   Sepsis Labs Recent Labs  Lab 07/22/22 1447 07/23/22 0320 07/24/22 0511  WBC 10.9* 9.2 8.8    Microbiology Recent Results (from the past 240 hour(s))  MRSA Next Gen by PCR, Nasal     Status: None   Collection Time: 07/22/22  4:46 PM   Specimen: Nasal Mucosa; Nasal Swab  Result Value Ref Range Status   MRSA by PCR Next Gen NOT DETECTED NOT DETECTED Final    Comment: (NOTE) The GeneXpert MRSA Assay (FDA approved for NASAL specimens only), is one component of a comprehensive MRSA colonization surveillance program. It is not intended to diagnose MRSA infection nor to guide or monitor treatment for MRSA infections. Test performance is not FDA approved in patients less than 12 years old. Performed at [facility], [address], [place], [place] [address]      Time coordinating discharge: Over 30 minutes  SIGNED:   Tresa Moore, MD  [facility] 07/25/2022, 2:05 PM Pager   If 7PM-7AM, please contact night-coverage

## 2022-07-25 NOTE — Telephone Encounter (Signed)
Admitted

## 2022-07-25 NOTE — Telephone Encounter (Signed)
   Transition of Care Follow-up Phone Call Request    Patient Name: Terrance Thompson Date of Birth: 1975/09/26 Date of Encounter: 07/25/2022  Primary Care Provider:  Patient, No Pcp Per Primary Cardiologist:  Lorine Bears, MD  [name] has been scheduled for a transition of care follow up appointment with a [facility] provider:  Cadence Furth on 6/11 @ 13:55.  Please reach out to [name] within 48 hours to confirm appointment and review transition of care protocol questionnaire.  Nicolasa Ducking, NP  07/25/2022, 9:30 AM

## 2022-07-25 NOTE — Progress Notes (Signed)
Cardiology Progress Note   Patient Name: Terrance Thompson Date of Encounter: 07/25/2022  Primary Cardiologist: Lorine Bears, MD  Subjective   Occasional sharp pain in chest, some soreness throughout chest.  No dyspnea.  Inpatient Medications    Scheduled Meds:  aspirin  81 mg Oral Daily   atorvastatin  80 mg Oral Daily   carvedilol  6.25 mg Oral BID WC   enoxaparin (LOVENOX) injection  40 mg Subcutaneous Q24H   ketorolac  15 mg Intravenous Q6H   lidocaine  1 patch Transdermal Q24H   losartan  25 mg Oral Daily   nicotine  21 mg Transdermal Daily   pantoprazole  40 mg Oral Daily   sodium chloride flush  3 mL Intravenous Q12H   ticagrelor  90 mg Oral BID   Continuous Infusions:  sodium chloride Stopped (07/23/22 1156)   sodium chloride     sodium chloride     ampicillin-sulbactam (UNASYN) IV 3 g (07/25/22 0844)   PRN Meds: sodium chloride, acetaminophen, ibuprofen, morphine injection, nicotine polacrilex, ondansetron (ZOFRAN) IV, mouth rinse, sodium chloride flush   Vital Signs    Vitals:   07/24/22 1944 07/25/22 0028 07/25/22 0326 07/25/22 0855  BP: (!) 169/87 (!) 137/98 126/89 133/83  Pulse: 92 71 67 71  Resp: 18 18 18 20   Temp: 98.3 F (36.8 C) 98.6 F (37 C) 97.8 F (36.6 C) 98.5 F (36.9 C)  TempSrc:   Oral   SpO2: 99% 98% 100% 99%  Weight:      Height:        Intake/Output Summary (Last 24 hours) at 07/25/2022 0921 Last data filed at 07/25/2022 1610 Gross per 24 hour  Intake 2080 ml  Output 150 ml  Net 1930 ml   Filed Weights   07/22/22 1445 07/22/22 1642  Weight: 103.4 kg 97.2 kg    Physical Exam   GEN: Well nourished, well developed, in no acute distress.  HEENT: Grossly normal.  Neck: Supple, no JVD, carotid bruits, or masses. Cardiac: RRR, no murmurs, rubs, or gallops. No clubbing, cyanosis, edema.  Radials 2+, DP/PT 2+ and equal bilaterally.  R radial site w/o bleeding/bruit/hematoma. Respiratory:  Respirations regular and unlabored, clear to  auscultation bilaterally. GI: Soft, nontender, nondistended, BS + x 4. MS: no deformity or atrophy. Skin: warm and dry, no rash. Neuro:  Strength and sensation are intact. Psych: AAOx3.  Normal affect.  Labs    Chemistry Recent Labs  Lab 07/22/22 1814 07/23/22 0320 07/24/22 0511 07/25/22 0614  NA 138 139 140 141  K 3.6 3.5 3.6 4.0  CL 106 107 108 110  CO2 22 21* 23 23  GLUCOSE 104* 98 102* 111*  BUN 16 19 22* 18  CREATININE 1.01 1.16 1.20 0.93  CALCIUM 8.8* 8.7* 8.4* 8.6*  PROT 7.4 7.0 6.8  --   ALBUMIN 4.3 3.9 3.6  --   AST 249* 240* 75*  --   ALT 68* 75* 47*  --   ALKPHOS 83 75 73  --   BILITOT 1.0 0.7 1.3*  --   GFRNONAA >60 >60 >60 >60  ANIONGAP 10 11 9 8      Hematology Recent Labs  Lab 07/22/22 1447 07/22/22 1552 07/23/22 0320 07/24/22 0511  WBC 10.9*  --  9.2 8.8  RBC 5.49  --  5.21 4.54  HGB 16.3 15.6 15.3 13.6  HCT 48.3 46.0 45.0 39.9  MCV 88.0  --  86.4 87.9  MCH 29.7  --  29.4 30.0  MCHC 33.7  --  34.0 34.1  RDW 14.1  --  14.5 14.0  PLT 287  --  252 179    Cardiac Enzymes  Recent Labs  Lab 07/22/22 1447 07/22/22 1645  TROPONINIHS 276* >24,000*      Lipids  Lab Results  Component Value Date   CHOL 204 (H) 07/22/2022   HDL 36 (L) 07/22/2022   LDLCALC 134 (H) 07/22/2022   TRIG 171 (H) 07/22/2022   CHOLHDL 5.7 07/22/2022    HbA1c  Lab Results  Component Value Date   HGBA1C 5.6 07/23/2022    Radiology    DG Abd 1 View  Result Date: 07/22/2022 CLINICAL DATA:  Evaluate NG tube placement EXAM: ABDOMEN - 1 VIEW COMPARISON:  None Available. FINDINGS: The NG tube terminates in the stomach. IMPRESSION: The NG tube terminates in the stomach. Electronically Signed   By: Gerome Sam III M.D.   On: 07/22/2022 17:52   DG Abdomen 1 View  Result Date: 07/22/2022 CLINICAL DATA:  Evaluate OG tube. EXAM: ABDOMEN - 1 VIEW COMPARISON:  None Available. FINDINGS: The distal tip of the OG tube is approximally 2.5 cm above the GE junction.  IMPRESSION: The distal tip of the OG tube is approximately 2.5 cm above the GE junction. Recommend advancing approximately 13 cm. Electronically Signed   By: Gerome Sam III M.D.   On: 07/22/2022 15:19   DG Chest Port 1 View  Result Date: 07/22/2022 CLINICAL DATA:  Post intubation.  OG tube placement. EXAM: PORTABLE CHEST 1 VIEW COMPARISON:  None Available. FINDINGS: The ETT terminates 1.7 cm proximal above the carina. Recommend withdrawing 1 cm. The NG tube is not identified on this chest x-ray. The heart, hila, mediastinum are normal. Bilateral pulmonary opacities could represent edema versus multifocal infiltrate/infection. Recommend clinical correlation. No other abnormalities. IMPRESSION: 1. The ETT terminates 1.7 cm proximal to the carina. Recommend withdrawing 1 cm. 2. The NG tube is not identified on this chest x-ray. Recommend clinical correlation. 3. Bilateral pulmonary opacities could represent edema versus multifocal infiltrate/infection. Electronically Signed   By: Gerome Sam III M.D.   On: 07/22/2022 15:19    Telemetry    RSR - Personally Reviewed  Cardiac Studies   Cardiac Catheterization and Percutaneous Coronary Intervention 6.1.2024  Diagnostic Dominance: Left  Intervention    Left Ventricle The left ventricular size is normal. There is moderate to severe left ventricular systolic dysfunction. LV end diastolic pressure is severely elevated. The left ventricular ejection fraction is 25-35% by visual estimate. There are LV function abnormalities due to segmental dysfunction.   1.  Out of hospital cardiac arrest due to anterior STEMI. 2.  Left dominant coronary arteries with significant three-vessel coronary artery disease.  The culprit is thrombotic occlusion of the proximal LAD which was treated successfully with PCI and drug-eluting stent placement (STENT ONYX FRONTIER 3.5X18).  There is 70% stenosis in the distal left circumflex at the bifurcation of OM 3 which  likely can be treated medically.  The RCA is subtotally occluded but it is small and nondominant. 3.  Moderately to severely reduced LV systolic function with severely elevated left ventricular end-diastolic pressure. 4.  Delay in door to device time due to the need to stabilize the patient and intubate. _____________  2D Echocardiogram 6.1.2024  1. There is moderate septal hypertrophy with otherwise mild concentric  LVH. Left ventricular ejection fraction, by estimation, is 35 to 40%. The  left ventricle has moderately decreased function. The left ventricle  demonstrates regional wall motion  abnormalities (see scoring diagram/findings for description). There is  moderate left ventricular hypertrophy of the septal segment. Left  ventricular diastolic parameters are consistent with Grade I diastolic  dysfunction (impaired relaxation).   2. Right ventricular systolic function is normal. The right ventricular  size is normal. There is normal pulmonary artery systolic pressure.   3. Left atrial size was mildly dilated.   4. The mitral valve is normal in structure. Trivial mitral valve  regurgitation. No evidence of mitral stenosis.   5. The aortic valve is tricuspid. Aortic valve regurgitation is not  visualized. No aortic stenosis is present.   6. The inferior vena cava is normal in size with greater than 50%  respiratory variability, suggesting right atrial pressure of 3 mmHg.  _____________    Patient Profile     47 y.o. male with history of tobacco and cocaine abuse admitted with out-of-hospital cardiac arrest.  He was defibrillated in the field for ventricular fibrillation.  Taken to the Cath Lab and found to have multivessel disease with a culprit lesion in the proximal LAD.  Underwent successful PCI.   Assessment & Plan    1.  Anterior STEMI:  Presented 5/30 w/ anterior STEMI complicated by VF arrest in the field req defib.  HsTrop > 24k.  Cath w/ 100% prox LAD s/p PCI/DES.   Residual distal LCX dzs and subtotally occluded nondominant RCA dzs.  No angina, though cont to have chest wall pain r/t CPR.  Cont med rx  Asa/brilinta, ? blocker, ARB, statin.  2.  Acute HFrEF/ICM:  EF 35-40% in setting of above.  Euvolemic on exam.  Cont ? blocker and ARB. Consider SGLT2i as outpt if not cost prohibitive.  Plan to f/u echo in 90 days.  3.  Polysubstance Abuse:  Tox screen + for marijuana and cocaine.  Also smokes cigarettes. Complete cessation advised.  4.  Transaminitis:  LFTs trending down as of yesterday. Will need outpt f/u.  5.  HL:  LDL 134.  Cont statin rx.  F/u lipids/lfts in 6-8 wks.  Signed, Nicolasa Ducking, NP  07/25/2022, 9:21 AM    For questions or updates, please contact   Please consult [email or web address] for contact info under Cardiology/STEMI.

## 2022-07-26 ENCOUNTER — Other Ambulatory Visit: Payer: Self-pay

## 2022-07-26 LAB — LIPOPROTEIN A (LPA): Lipoprotein (a): 137 nmol/L — ABNORMAL HIGH (ref <75.0)

## 2022-07-27 NOTE — Telephone Encounter (Signed)
TOC call attempt #1 Left a message for the patient to call back.

## 2022-07-28 ENCOUNTER — Other Ambulatory Visit: Payer: Self-pay

## 2022-08-01 ENCOUNTER — Ambulatory Visit: Payer: Self-pay | Admitting: Medical

## 2022-08-01 NOTE — Progress Notes (Deleted)
Cardiology Office Note:    Date:  08/01/2022   ID:  [name], DOB [birth year], MRN [id number]  PCP:  Patient, No Pcp Per  [facility] Cardiologist:  Lorine Bears, MD  [facility] Electrophysiologist:  None   Referring MD: No ref. provider found   Chief Complaint: Hospital follow-up  History of Present Illness:    Terrance Thompson is a 47 y.o. male with a hx of tobacco use, cocaine use presents for hospital follow-up.  Patient was recently admitted to [facility].  He sustained loss of consciousness while at home and family started CPR.  EMS performed defibrillation for VF and ACLS with subsequent ROSC.  Patient was taken for emergent cath for anterior STEMI.  During cardiac cath he required intubation.  He had successful PCI to proximal LAD.  EF noted to be 25 to 35%.  Patient had subsequent residual disease.  He was started on DAPT for at least 12 months.  No past medical history on file.  Past Surgical History:  Procedure Laterality Date   CORONARY/GRAFT ACUTE MI REVASCULARIZATION N/A 07/22/2022   Procedure: Coronary/Graft Acute MI Revascularization;  Surgeon: Iran Ouch, MD;  Location: [facility] INVASIVE CV LAB;  Service: Cardiovascular;  Laterality: N/A;   LEFT HEART CATH AND CORONARY ANGIOGRAPHY N/A 07/22/2022   Procedure: LEFT HEART CATH AND CORONARY ANGIOGRAPHY;  Surgeon: Iran Ouch, MD;  Location: [facility] INVASIVE CV LAB;  Service: Cardiovascular;  Laterality: N/A;    Current Medications: No outpatient medications have been marked as taking for the 08/01/22 encounter (Appointment) with Fransico Michael, Alleen Kehm H, PA-C.     Allergies:   Patient has no known allergies.   Social History   Socioeconomic History   Marital status: Single    Spouse name: Not on file   Number of children: Not on file   Years of education: Not on file   Highest education level: Not on file  Occupational History   Not on file  Tobacco Use   Smoking status: Former   Smokeless tobacco: Former   Substance and Sexual Activity   Alcohol use: Not on file   Drug use: Not on file   Sexual activity: Not on file  Other Topics Concern   Not on file  Social History Narrative   Not on file   Social Determinants of Health   Financial Resource Strain: Not on file  Food Insecurity: Not on file  Transportation Needs: Not on file  Physical Activity: Not on file  Stress: Not on file  Social Connections: Not on file     Family History: The patient's ***family history is not on file.  ROS:   Please see the history of present illness.    *** All other systems reviewed and are negative.  EKGs/Labs/Other Studies Reviewed:    The following studies were reviewed today: ***  EKG:  EKG is *** ordered today.  The ekg ordered today demonstrates ***  Recent Labs: 07/24/2022: ALT 47; Hemoglobin 13.6; Platelets 179 07/25/2022: BUN 18; Creatinine, Ser 0.93; Magnesium 2.2; Potassium 4.0; Sodium 141  Recent Lipid Panel    Component Value Date/Time   CHOL 204 (H) 07/22/2022 1447   TRIG 171 (H) 07/22/2022 1447   HDL 36 (L) 07/22/2022 1447   CHOLHDL 5.7 07/22/2022 1447   VLDL 34 07/22/2022 1447   LDLCALC 134 (H) 07/22/2022 1447     Risk Assessment/Calculations:   {Does this patient have ATRIAL FIBRILLATION?[phone]}   Physical Exam:    VS:  There were  no vitals taken for this visit.    Wt Readings from Last 3 Encounters:  07/22/22 214 lb 4.6 oz (97.2 kg)  02/17/18 228 lb (103.4 kg)     GEN: *** Well nourished, well developed in no acute distress HEENT: Normal NECK: No JVD; No carotid bruits LYMPHATICS: No lymphadenopathy CARDIAC: ***RRR, no murmurs, rubs, gallops RESPIRATORY:  Clear to auscultation without rales, wheezing or rhonchi  ABDOMEN: Soft, non-tender, non-distended MUSCULOSKELETAL:  No edema; No deformity  SKIN: Warm and dry NEUROLOGIC:  Alert and oriented x 3 PSYCHIATRIC:  Normal affect   ASSESSMENT:    No diagnosis found. PLAN:    In order of problems  listed above:  ***  Disposition: Follow up {follow up:15908} with ***   Shared Decision Making/Informed Consent   {Are you ordering a CV Procedure (e.g. stress test, cath, DCCV, TEE, etc)?   Press F2        :[id number]}    Signed, Francesa Eugenio Ardelle Lesches  08/01/2022 7:49 AM    [facility]

## 2022-08-03 NOTE — Telephone Encounter (Signed)
Reviewed the patient's chart.  He is out of the window for a TOC call.  He was scheduled to follow up on 08/01/22 with Cadence Furth, PA, but cancelled and rescheduled to 08/08/22 @ 10:30 am with Cadence.  Closing this encounter.

## 2022-08-08 ENCOUNTER — Other Ambulatory Visit
Admission: RE | Admit: 2022-08-08 | Discharge: 2022-08-08 | Disposition: A | Discharge status: Home / Self Care | Payer: Self-pay | Source: Ambulatory Visit | Attending: Medical | Admitting: Medical

## 2022-08-08 ENCOUNTER — Ambulatory Visit: Payer: Self-pay | Attending: Medical | Admitting: Medical

## 2022-08-08 ENCOUNTER — Other Ambulatory Visit: Payer: Self-pay

## 2022-08-08 ENCOUNTER — Encounter: Payer: Self-pay | Admitting: Medical

## 2022-08-08 VITALS — BP 138/94 | HR 68 | Ht 73.0 in | Wt 213.4 lb

## 2022-08-08 DIAGNOSIS — E782 Mixed hyperlipidemia: Secondary | ICD-10-CM

## 2022-08-08 DIAGNOSIS — I25118 Atherosclerotic heart disease of native coronary artery with other forms of angina pectoris: Secondary | ICD-10-CM

## 2022-08-08 DIAGNOSIS — I469 Cardiac arrest, cause unspecified: Secondary | ICD-10-CM

## 2022-08-08 DIAGNOSIS — I5020 Unspecified systolic (congestive) heart failure: Secondary | ICD-10-CM

## 2022-08-08 DIAGNOSIS — F1990 Other psychoactive substance use, unspecified, uncomplicated: Secondary | ICD-10-CM

## 2022-08-08 DIAGNOSIS — I10 Essential (primary) hypertension: Secondary | ICD-10-CM

## 2022-08-08 DIAGNOSIS — R7989 Other specified abnormal findings of blood chemistry: Secondary | ICD-10-CM

## 2022-08-08 DIAGNOSIS — I255 Ischemic cardiomyopathy: Secondary | ICD-10-CM

## 2022-08-08 DIAGNOSIS — I2109 ST elevation (STEMI) myocardial infarction involving other coronary artery of anterior wall: Secondary | ICD-10-CM

## 2022-08-08 DIAGNOSIS — Z79899 Other long term (current) drug therapy: Secondary | ICD-10-CM

## 2022-08-08 LAB — CBC
HCT: 40.4 % (ref 39.0–52.0)
Hemoglobin: 13.8 g/dL (ref 13.0–17.0)
MCH: 29.9 pg (ref 26.0–34.0)
MCHC: 34.2 g/dL (ref 30.0–36.0)
MCV: 87.4 fL (ref 80.0–100.0)
Platelets: 437 10*3/uL — ABNORMAL HIGH (ref 150–400)
RBC: 4.62 MIL/uL (ref 4.22–5.81)
RDW: 13.6 % (ref 11.5–15.5)
WBC: 6.5 10*3/uL (ref 4.0–10.5)
nRBC: 0 % (ref 0.0–0.2)

## 2022-08-08 LAB — HEPATIC FUNCTION PANEL
ALT: 22 U/L (ref 0–44)
AST: 19 U/L (ref 15–41)
Albumin: 3.8 g/dL (ref 3.5–5.0)
Alkaline Phosphatase: 85 U/L (ref 38–126)
Bilirubin, Direct: <0.1 mg/dL (ref 0.0–0.2)
Total Bilirubin: 0.6 mg/dL (ref 0.3–1.2)
Total Protein: 7.1 g/dL (ref 6.5–8.1)

## 2022-08-08 MED ORDER — SPIRONOLACTONE 25 MG PO TABS
12.5000 mg | ORAL_TABLET | Freq: Every day | ORAL | 3 refills | Status: AC
  Filled 2022-08-08: qty 45, 90d supply, fill #0

## 2022-08-08 MED ORDER — DAPAGLIFLOZIN PROPANEDIOL 10 MG PO TABS
10.0000 mg | ORAL_TABLET | Freq: Every day | ORAL | 3 refills | Status: AC
  Filled 2022-08-08: qty 90, 90d supply, fill #0

## 2022-08-08 MED ORDER — TICAGRELOR 90 MG PO TABS
90.0000 mg | ORAL_TABLET | Freq: Two times a day (BID) | ORAL | 3 refills | Status: AC
  Filled 2022-08-08: qty 180, 90d supply, fill #0

## 2022-08-08 MED ORDER — CARVEDILOL 6.25 MG PO TABS
6.2500 mg | ORAL_TABLET | Freq: Two times a day (BID) | ORAL | 3 refills | Status: AC
  Filled 2022-08-08: qty 180, 90d supply, fill #0

## 2022-08-08 MED ORDER — ATORVASTATIN CALCIUM 80 MG PO TABS: 80.0000 mg | ORAL_TABLET | Freq: Every day | ORAL | 3 refills | Status: DC

## 2022-08-08 MED ORDER — NITROGLYCERIN 0.4 MG SL SUBL: 0.4000 mg | SUBLINGUAL_TABLET | SUBLINGUAL | 3 refills | Status: DC | PRN

## 2022-08-08 MED ORDER — LOSARTAN POTASSIUM 25 MG PO TABS: 25.0000 mg | ORAL_TABLET | Freq: Every day | ORAL | 3 refills | Status: DC

## 2022-08-08 MED ORDER — ATORVASTATIN CALCIUM 80 MG PO TABS
80.0000 mg | ORAL_TABLET | Freq: Every day | ORAL | 3 refills | Status: AC
  Filled 2022-08-08: qty 90, 90d supply, fill #0

## 2022-08-08 MED ORDER — NITROGLYCERIN 0.4 MG SL SUBL
0.4000 mg | SUBLINGUAL_TABLET | SUBLINGUAL | 3 refills | Status: AC | PRN
  Filled 2022-08-08: qty 25, 8d supply, fill #0

## 2022-08-08 MED ORDER — SPIRONOLACTONE 25 MG PO TABS: 12.5000 mg | ORAL_TABLET | Freq: Every day | ORAL | 3 refills | Status: DC

## 2022-08-08 MED ORDER — DAPAGLIFLOZIN PROPANEDIOL 10 MG PO TABS: 10.0000 mg | ORAL_TABLET | Freq: Every day | ORAL | 3 refills | Status: DC

## 2022-08-08 MED ORDER — CARVEDILOL 6.25 MG PO TABS: 6.2500 mg | ORAL_TABLET | Freq: Two times a day (BID) | ORAL | 3 refills | Status: DC

## 2022-08-08 MED ORDER — TICAGRELOR 90 MG PO TABS: 90.0000 mg | ORAL_TABLET | Freq: Two times a day (BID) | ORAL | 3 refills | Status: DC

## 2022-08-08 MED ORDER — LOSARTAN POTASSIUM 25 MG PO TABS
25.0000 mg | ORAL_TABLET | Freq: Every day | ORAL | 3 refills | Status: AC
  Filled 2022-08-08: qty 90, 90d supply, fill #0

## 2022-08-08 NOTE — Patient Instructions (Signed)
Medication Instructions:  Your physician recommends the following medication changes.  STOP TAKING: Farxiga 10 mg by mouth daily A prescription has been sent in for Nitroglycerin.  If you have chest pain that doesn't relieve quickly, place one tablet under your tongue and allow it to dissolve.  If no relief after 5 minutes, you may take another pill.  If no relief after 5 minutes, you may take a 3rd dose but you need to call 911 and report to ER immediately.   *If you need a refill on your cardiac medications before your next appointment, please call your pharmacy*   Lab Work: Your provider would like for you to have following labs drawn: (CBC, LFT).   Please go to the [facility] entrance and check in at the front desk.  You do not need an appointment.  They are open from 7am-6 pm.     Testing/Procedures: None ordered today   Follow-Up: At [facility], you and your health needs are our priority.  As part of our continuing mission to provide you with exceptional heart care, we have created designated Provider Care Teams.  These Care Teams include your primary Cardiologist (physician) and Advanced Practice Providers (APPs -  Physician Assistants and Nurse Practitioners) who all work together to provide you with the care you need, when you need it.  We recommend signing up for the patient portal called "MyChart".  Sign up information is provided on this After Visit Summary.  MyChart is used to connect with patients for Virtual Visits (Telemedicine).  Patients are able to view lab/test results, encounter notes, upcoming appointments, etc.  Non-urgent messages can be sent to your provider as well.   To learn more about what you can do with MyChart, go to [email or web address].    Your next appointment:   1 month(s)  Provider:   You may see Lorine Bears, MD or one of the following Advanced Practice Providers on your designated Care Team:   Nicolasa Ducking, NP Eula Listen, PA-C Cadence Fransico Michael, PA-C Charlsie Quest, NP

## 2022-08-08 NOTE — Progress Notes (Signed)
Cardiology Office Note:    Date:  08/08/2022   ID:  [name], DOB 1976/02/11, MRN [id number]  PCP:  Patient, No Pcp Per  [facility] Cardiologist:  Lorine Bears, MD  [facility] Electrophysiologist:  None   Referring MD: No ref. provider found   Chief Complaint: Hospital follow-up  History of Present Illness:    Terrance Thompson is a 47 y.o. male with a hx of tobacco, cocaine use who was recently admitted for out-of hospital cardiac arrest.   The patient was admitted 07/2022 to [facility] with out of hospital cardiac arrest, He was defibrillated in the field for vfib. He was taken to the cath lab and found to have multivessel disease with culprit lesion in the pLAD and underwent successful PCI. HS trop up to 24K. Echo showed LVEF 35-40%, He was started on ASA, Brilinta, BB, Arb and statin. Tox screen with marijuana and cocaine.   Today, the patient reports he has been tired and sore. Reports chest pain when he lays on his side. No pain with palpation. He has some shortness of breath. Feels breathing is anxiety related. He has lower leg edema, the same as in the hospital. Reports some memory issues. Occasional lightheadedness and dizziness when he stands up. 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 He reports compliance with medications. He is smoking 1/2 pack. HE is still using marijuana and cocaine. He can't do light duty at work and can't work part-time.   Past Medical History:  Diagnosis Date   Acute ST elevation myocardial infarction (STEMI) (HCC)    CAD (coronary artery disease)    Cardiac arrest (HCC)    Cocaine use    HFrEF (heart failure with reduced ejection fraction) (HCC)    HLD (hyperlipidemia)    HTN (hypertension)    Ischemic cardiomyopathy    Marijuana use    Tobacco use     Past Surgical History:  Procedure Laterality Date   CORONARY/GRAFT ACUTE MI REVASCULARIZATION N/A 07/22/2022   Procedure: Coronary/Graft Acute MI Revascularization;  Surgeon: Iran Ouch, MD;   Location: [facility] INVASIVE CV LAB;  Service: Cardiovascular;  Laterality: N/A;   LEFT HEART CATH AND CORONARY ANGIOGRAPHY N/A 07/22/2022   Procedure: LEFT HEART CATH AND CORONARY ANGIOGRAPHY;  Surgeon: Iran Ouch, MD;  Location: [facility] INVASIVE CV LAB;  Service: Cardiovascular;  Laterality: N/A;    Current Medications: Current Meds  Medication Sig   aspirin 81 MG chewable tablet Chew 1 tablet (81 mg total) by mouth daily.   [DISCONTINUED] atorvastatin (LIPITOR) 80 MG tablet Take 1 tablet (80 mg total) by mouth daily.   [DISCONTINUED] carvedilol (COREG) 6.25 MG tablet Take 1 tablet (6.25 mg total) by mouth 2 (two) times daily with a meal.   [DISCONTINUED] dapagliflozin propanediol (FARXIGA) 10 MG TABS tablet Take 1 tablet (10 mg total) by mouth daily before breakfast.   [DISCONTINUED] losartan (COZAAR) 25 MG tablet Take 1 tablet (25 mg total) by mouth daily.   [DISCONTINUED] nitroGLYCERIN (NITROSTAT) 0.4 MG SL tablet Place 1 tablet (0.4 mg total) under the tongue every 5 (five) minutes as needed for chest pain.   [DISCONTINUED] spironolactone (ALDACTONE) 25 MG tablet Take 0.5 tablets (12.5 mg total) by mouth daily.   [DISCONTINUED] ticagrelor (BRILINTA) 90 MG TABS tablet Take 1 tablet (90 mg total) by mouth 2 (two) times daily.     Allergies:   Patient has no known allergies.   Social History   Socioeconomic History   Marital status: Single  Spouse name: Not on file   Number of children: Not on file   Years of education: Not on file   Highest education level: Not on file  Occupational History   Not on file  Tobacco Use   Smoking status: Every Day    Packs/day: .5    Types: Cigarettes   Smokeless tobacco: Former  Building services engineer Use: Never used  Substance and Sexual Activity   Alcohol use: Not Currently   Drug use: Not Currently   Sexual activity: Not on file  Other Topics Concern   Not on file  Social History Narrative   Not on file   Social Determinants of Health    Financial Resource Strain: Not on file  Food Insecurity: Not on file  Transportation Needs: Not on file  Physical Activity: Not on file  Stress: Not on file  Social Connections: Not on file     Family History: The patient's family history includes Aneurysm in his maternal grandmother; Heart attack in his father.  ROS:   Please see the history of present illness.     All other systems reviewed and are negative.  EKGs/Labs/Other Studies Reviewed:    The following studies were reviewed today:  Echo 07/2022  1. There is moderate septal hypertrophy with otherwise mild concentric  LVH. Left ventricular ejection fraction, by estimation, is 35 to 40%. The  left ventricle has moderately decreased function. The left ventricle  demonstrates regional wall motion  abnormalities (see scoring diagram/findings for description). There is  moderate left ventricular hypertrophy of the septal segment. Left  ventricular diastolic parameters are consistent with Grade I diastolic  dysfunction (impaired relaxation).   2. Right ventricular systolic function is normal. The right ventricular  size is normal. There is normal pulmonary artery systolic pressure.   3. Left atrial size was mildly dilated.   4. The mitral valve is normal in structure. Trivial mitral valve  regurgitation. No evidence of mitral stenosis.   5. The aortic valve is tricuspid. Aortic valve regurgitation is not  visualized. No aortic stenosis is present.   6. The inferior vena cava is normal in size with greater than 50%  respiratory variability, suggesting right atrial pressure of 3 mmHg.   Cardiac cath 07/2022     Prox RCA lesion is 90% stenosed.   Prox RCA to Mid RCA lesion is 99% stenosed.   RV Branch lesion is 99% stenosed.   1st Mrg lesion is 60% stenosed.   Mid Cx lesion is 40% stenosed.   Prox LAD lesion is 100% stenosed.   Dist Cx lesion is 70% stenosed.   3rd Mrg lesion is 40% stenosed.   A drug-eluting stent was  successfully placed using a STENT ONYX FRONTIER 3.5X18.   Post intervention, there is a 0% residual stenosis.   There is moderate to severe left ventricular systolic dysfunction.   LV end diastolic pressure is severely elevated.   The left ventricular ejection fraction is 25-35% by visual estimate.   1.  Out of hospital cardiac arrest due to anterior STEMI. 2.  Left dominant coronary arteries with significant three-vessel coronary artery disease.  The culprit is thrombotic occlusion of the proximal LAD which was treated successfully with PCI and drug-eluting stent placement.  There is 70% stenosis in the distal left circumflex at the bifurcation of OM 3 which likely can be treated medically.  The RCA is subtotally occluded but it is small and nondominant. 3.  Moderately to severely reduced  LV systolic function with severely elevated left ventricular end-diastolic pressure. 4.  Delay in door to device time due to the need to stabilize the patient and intubate.   Recommendations: Continue dual antiplatelet therapy for at least 12 months. Continue cangrelor infusion for another 3 hours. The patient was given 1 dose of IV furosemide with excellent urine output. Vent management per critical care.   Coronary Diagrams  Diagnostic Dominance: Left  Intervention      EKG:  EKG is ordered today.  The ekg ordered today demonstrates NSR 67bpm, residual anterolateral TWI  Recent Labs: 07/25/2022: BUN 18; Creatinine, Ser 0.93; Magnesium 2.2; Potassium 4.0; Sodium 141 08/08/2022: ALT 22; Hemoglobin 13.8; Platelets 437  Recent Lipid Panel    Component Value Date/Time   CHOL 204 (H) 07/22/2022 1447   TRIG 171 (H) 07/22/2022 1447   HDL 36 (L) 07/22/2022 1447   CHOLHDL 5.7 07/22/2022 1447   VLDL 34 07/22/2022 1447   LDLCALC 134 (H) 07/22/2022 1447   Physical Exam:    VS:  BP (!) 138/94 (BP Location: Left Arm, Patient Position: Sitting, Cuff Size: Large)   Pulse 68   Ht 6\' 1"  (1.854 m)   Wt  213 lb 6.4 oz (96.8 kg)   SpO2 98%   BMI 28.15 kg/m     Wt Readings from Last 3 Encounters:  08/08/22 213 lb 6.4 oz (96.8 kg)  07/22/22 214 lb 4.6 oz (97.2 kg)  02/17/18 228 lb (103.4 kg)     GEN:  Well nourished, well developed in no acute distress HEENT: Normal NECK: No JVD; No carotid bruits LYMPHATICS: No lymphadenopathy CARDIAC: RRR, no murmurs, rubs, gallops RESPIRATORY:  Clear to auscultation without rales, wheezing or rhonchi  ABDOMEN: Soft, non-tender, non-distended MUSCULOSKELETAL:  No edema; No deformity  SKIN: Warm and dry NEUROLOGIC:  Alert and oriented x 3 PSYCHIATRIC:  Normal affect   ASSESSMENT:    1. HFrEF (heart failure with reduced ejection fraction) (HCC)   2. Cardiac arrest (HCC)   3. Acute ST elevation myocardial infarction (STEMI) of anterior wall (HCC)   4. Coronary artery disease involving native coronary artery of native heart with other form of angina pectoris (HCC)   5. Ischemic cardiomyopathy   6. Medication management   7. Hyperlipidemia, mixed   8. Essential hypertension   9. Abnormal LFTs   10. Polysubstance use disorder    PLAN:    In order of problems listed above:  Cardiac Arrest Acute STEMI CAD s/p DES x 1 LAD Out of hospital cardiac arrest requiring defib for Vfib. Urgent cath showed 100% occlusion of LAD treated with PCI/DES. He was started on Aspirin and Brilinta for 12 months. He is still feeling overall tired and weak. He has atypical chest pains and some minor shortness of breath. EKG shows NSR with residual TWI anterolateral leads. 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 I will wait to refer to cardiac rehab until follow-up. Continue Aspirin, Brilinta, Coreg, Losartan. CBC today. Cath site is stable. HE was provided with SL NTG.   HFrEF ICM Echo in the hospital showed LVEF  35-40%. He was sent home on Coreg 6.25mg  BID, Losartan 25mg  daily, and spironolactone  12.5mg  daily. He does not have insurance. He is euvolemic on exam. We will send Marcelline Deist to [facility] pharmacy and/or start patient assistance program if needed.   HLD LDL 134, HDL 36, TG 171, total chol 204. Continue Lipitor.  Will re-check lipids at follow-up.   Abnormal LFTs Re-check LFTs today.  Polysubstance use He reports marijuana use, no current cocaine use. He is still smoking. Complete cessation advised.   Disposition: Follow up in 1 month(s) with MD/APP    Signed, Monica Codd Keaghan Stall, PA-C  08/08/2022 5:32 PM     [facility]

## 2022-08-10 ENCOUNTER — Other Ambulatory Visit: Payer: Self-pay

## 2022-08-10 ENCOUNTER — Telehealth: Payer: Self-pay

## 2022-08-10 ENCOUNTER — Telehealth: Payer: Self-pay | Admitting: Cardiovascular Disease

## 2022-08-10 NOTE — Telephone Encounter (Signed)
The patient has been notified of the result along with recommendations.Pt verbalized understanding. All questions (if any) were answered      

## 2022-08-10 NOTE — Telephone Encounter (Signed)
Brilinta & Farxiga provider portion of patient assistance application completed and faxed back to [place] at [facility]

## 2022-08-10 NOTE — Telephone Encounter (Signed)
Patient returned RN's call. 

## 2022-08-25 ENCOUNTER — Telehealth: Payer: Self-pay | Admitting: Medical

## 2022-08-25 NOTE — Telephone Encounter (Signed)
New Message:     Patient says he need a  note to return to work please. He would like to talk to Cadence, if she have the time time today please.s

## 2022-08-25 NOTE — Telephone Encounter (Signed)
Called patient to discuss what was needed for return letter- he already has a letter taking him out of work until 07/18, called to clarify he was going back before this.   Unable to reach patient or leave a message.  Will try again.

## 2022-08-28 NOTE — Telephone Encounter (Signed)
Called patient and he stated that he needed a new work note that released him of restrictions to go back to work immediately. After speaking with provider a new return to work note was written. Informed patient he was able to pick the letter up at his earliest convenience.

## 2022-08-28 NOTE — Telephone Encounter (Signed)
Pt is returning the nurse call regarding his letter to return back to back. He stated he wanted to return back sooner than 7/18. Please advise

## 2022-09-14 ENCOUNTER — Ambulatory Visit: Payer: Self-pay | Attending: Medical | Admitting: Medical

## 2022-09-14 NOTE — Progress Notes (Deleted)
Cardiology Office Note:    Date:  09/14/2022   ID:  ABDINASIR Thompson, DOB 1975-04-30, MRN [id number]  PCP:  Patient, No Pcp Per  [facility] Cardiologist:  Lorine Bears, MD  [facility] Electrophysiologist:  None   Referring MD: No ref. provider found   Chief Complaint: ***  History of Present Illness:    Terrance Thompson is a 47 y.o. male with a hx of tobacco, cocaine use who was recently admitted for out-of hospital cardiac arrest.    The patient was admitted 07/2022 to [facility] with out of hospital cardiac arrest, He was defibrillated in the field for vfib. He was taken to the cath lab and found to have multivessel disease with culprit lesion in the pLAD and underwent successful PCI. HS trop up to 24K. Echo showed LVEF 35-40%, He was started on ASA, Brilinta, BB, Arb and statin. Tox screen with marijuana and cocaine.   The patient was last seen 08/08/22 and reported chest pain when laying on his side. He was still using marijuana and cocaine. He was referred to cardiac rehab and given SL NTG. He was started on Farxiga 10mg  daily.   Today  lipids  Past Medical History:  Diagnosis Date   Acute ST elevation myocardial infarction (STEMI) (HCC)    CAD (coronary artery disease)    Cardiac arrest (HCC)    Cocaine use    HFrEF (heart failure with reduced ejection fraction) (HCC)    HLD (hyperlipidemia)    HTN (hypertension)    Ischemic cardiomyopathy    Marijuana use    Tobacco use     Past Surgical History:  Procedure Laterality Date   CORONARY/GRAFT ACUTE MI REVASCULARIZATION N/A 07/22/2022   Procedure: Coronary/Graft Acute MI Revascularization;  Surgeon: Iran Ouch, MD;  Location: [facility] INVASIVE CV LAB;  Service: Cardiovascular;  Laterality: N/A;   LEFT HEART CATH AND CORONARY ANGIOGRAPHY N/A 07/22/2022   Procedure: LEFT HEART CATH AND CORONARY ANGIOGRAPHY;  Surgeon: Iran Ouch, MD;  Location: [facility] INVASIVE CV LAB;  Service: Cardiovascular;  Laterality: N/A;    Current  Medications: No outpatient medications have been marked as taking for the 09/14/22 encounter (Appointment) with Fransico Michael,  H, PA-C.     Allergies:   Patient has no known allergies.   Social History   Socioeconomic History   Marital status: Single    Spouse name: Not on file   Number of children: Not on file   Years of education: Not on file   Highest education level: Not on file  Occupational History   Not on file  Tobacco Use   Smoking status: Every Day    Current packs/day: 0.50    Types: Cigarettes   Smokeless tobacco: Former  Building services engineer status: Never Used  Substance and Sexual Activity   Alcohol use: Not Currently   Drug use: Not Currently   Sexual activity: Not on file  Other Topics Concern   Not on file  Social History Narrative   Not on file   Social Determinants of Health   Financial Resource Strain: Not on file  Food Insecurity: Not on file  Transportation Needs: Not on file  Physical Activity: Not on file  Stress: Not on file  Social Connections: Not on file     Family History: The patient's ***family history includes Aneurysm in his maternal grandmother; Heart attack in his father.  ROS:   Please see the history of present illness.    *** All  other systems reviewed and are negative.  EKGs/Labs/Other Studies Reviewed:    The following studies were reviewed today: ***  EKG:  EKG is *** ordered today.  The ekg ordered today demonstrates ***  Recent Labs: 07/25/2022: BUN 18; Creatinine, Ser 0.93; Magnesium 2.2; Potassium 4.0; Sodium 141 08/08/2022: ALT 22; Hemoglobin 13.8; Platelets 437  Recent Lipid Panel    Component Value Date/Time   CHOL 204 (H) 07/22/2022 1447   TRIG 171 (H) 07/22/2022 1447   HDL 36 (L) 07/22/2022 1447   CHOLHDL 5.7 07/22/2022 1447   VLDL 34 07/22/2022 1447   LDLCALC 134 (H) 07/22/2022 1447     Risk Assessment/Calculations:   {Does this patient have ATRIAL FIBRILLATION?[phone]}   Physical Exam:     VS:  There were no vitals taken for this visit.    Wt Readings from Last 3 Encounters:  08/08/22 213 lb 6.4 oz (96.8 kg)  07/22/22 214 lb 4.6 oz (97.2 kg)  02/17/18 228 lb (103.4 kg)     GEN: *** Well nourished, well developed in no acute distress HEENT: Normal NECK: No JVD; No carotid bruits LYMPHATICS: No lymphadenopathy CARDIAC: ***RRR, no murmurs, rubs, gallops RESPIRATORY:  Clear to auscultation without rales, wheezing or rhonchi  ABDOMEN: Soft, non-tender, non-distended MUSCULOSKELETAL:  No edema; No deformity  SKIN: Warm and dry NEUROLOGIC:  Alert and oriented x 3 PSYCHIATRIC:  Normal affect   ASSESSMENT:    No diagnosis found. PLAN:    In order of problems listed above:  ***  Disposition: Follow up {follow up:15908} with ***   Shared Decision Making/Informed Consent   {Are you ordering a CV Procedure (e.g. stress test, cath, DCCV, TEE, etc)?   Press F2        :[id number]}    Signed,  Kory Stall, PA-C  09/14/2022 8:05 AM    [facility]

## 2022-09-15 ENCOUNTER — Encounter: Payer: Self-pay | Admitting: Medical

## 2022-10-09 ENCOUNTER — Other Ambulatory Visit: Payer: Self-pay

## 2022-10-10 ENCOUNTER — Other Ambulatory Visit: Payer: Self-pay

## 2022-12-04 ENCOUNTER — Other Ambulatory Visit: Payer: Self-pay

## 2022-12-18 ENCOUNTER — Other Ambulatory Visit: Payer: Self-pay

## 2023-01-29 ENCOUNTER — Other Ambulatory Visit: Payer: Self-pay

## 2023-02-05 ENCOUNTER — Other Ambulatory Visit: Payer: Self-pay

## 2023-03-05 ENCOUNTER — Other Ambulatory Visit: Payer: Self-pay

## 2023-04-25 ENCOUNTER — Other Ambulatory Visit: Payer: Self-pay

## 2023-05-15 ENCOUNTER — Other Ambulatory Visit: Payer: Self-pay

## 2023-05-23 ENCOUNTER — Other Ambulatory Visit: Payer: Self-pay

## 2023-05-30 ENCOUNTER — Other Ambulatory Visit: Payer: Self-pay

## 2023-05-30 ENCOUNTER — Telehealth: Payer: Self-pay

## 2023-05-30 NOTE — Telephone Encounter (Signed)
 Patient assistance forms for [place] and Brilinta completed by Cadence Lorna Few and faxed back to [place] at [facility]

## 2023-06-20 ENCOUNTER — Other Ambulatory Visit: Payer: Self-pay

## 2023-07-09 ENCOUNTER — Other Ambulatory Visit: Payer: Self-pay

## 2023-07-25 ENCOUNTER — Other Ambulatory Visit: Payer: Self-pay

## 2023-08-15 ENCOUNTER — Other Ambulatory Visit: Payer: Self-pay

## 2023-09-20 ENCOUNTER — Other Ambulatory Visit: Payer: Self-pay

## 2023-09-25 ENCOUNTER — Other Ambulatory Visit: Payer: Self-pay
